# Patient Record
Sex: Female | Born: 1945 | Race: Black or African American | Hispanic: No | Marital: Single | Smoking: Current every day smoker
Health system: Southern US, Community
[De-identification: ages and names within clinical notes are randomized; demographics above are authoritative.]

## PROBLEM LIST (undated history)

## (undated) DIAGNOSIS — E669 Obesity, unspecified: Secondary | ICD-10-CM

## (undated) DIAGNOSIS — M1990 Unspecified osteoarthritis, unspecified site: Secondary | ICD-10-CM

## (undated) DIAGNOSIS — E119 Type 2 diabetes mellitus without complications: Secondary | ICD-10-CM

## (undated) DIAGNOSIS — R519 Headache, unspecified: Secondary | ICD-10-CM

## (undated) DIAGNOSIS — I10 Essential (primary) hypertension: Secondary | ICD-10-CM

## (undated) DIAGNOSIS — R51 Headache: Secondary | ICD-10-CM

## (undated) DIAGNOSIS — E0500 Thyrotoxicosis with diffuse goiter without thyrotoxic crisis or storm: Secondary | ICD-10-CM

## (undated) HISTORY — PX: LUMBAR DISC SURGERY: SHX700

## (undated) HISTORY — PX: TONSILLECTOMY: SUR1361

## (undated) HISTORY — PX: CHOLECYSTECTOMY: SHX55

## (undated) HISTORY — PX: HERNIA REPAIR: SHX51

## (undated) HISTORY — PX: OTHER SURGICAL HISTORY: SHX169

## (undated) HISTORY — PX: THYROIDECTOMY: SHX17

## (undated) HISTORY — PX: ABDOMINAL HYSTERECTOMY: SHX81

## (undated) HISTORY — PX: APPENDECTOMY: SHX54

---

## 2011-08-10 ENCOUNTER — Ambulatory Visit (INDEPENDENT_AMBULATORY_CARE_PROVIDER_SITE_OTHER): Payer: Managed Care, Other (non HMO)

## 2011-08-10 ENCOUNTER — Inpatient Hospital Stay (INDEPENDENT_AMBULATORY_CARE_PROVIDER_SITE_OTHER)
Admission: RE | Admit: 2011-08-10 | Discharge: 2011-08-10 | Disposition: A | Discharge status: Home / Self Care | Payer: Managed Care, Other (non HMO) | Source: Ambulatory Visit | Attending: Family Medicine | Admitting: Family Medicine

## 2011-08-10 ENCOUNTER — Emergency Department (HOSPITAL_COMMUNITY)
Admission: EM | Admit: 2011-08-10 | Discharge: 2011-08-11 | Disposition: A | Discharge status: Home / Self Care | Payer: Managed Care, Other (non HMO) | Attending: Emergency Medicine | Admitting: Emergency Medicine

## 2011-08-10 ENCOUNTER — Emergency Department (HOSPITAL_COMMUNITY): Payer: Managed Care, Other (non HMO)

## 2011-08-10 DIAGNOSIS — R1031 Right lower quadrant pain: Secondary | ICD-10-CM | POA: Insufficient documentation

## 2011-08-10 DIAGNOSIS — K7689 Other specified diseases of liver: Secondary | ICD-10-CM | POA: Insufficient documentation

## 2011-08-10 DIAGNOSIS — E119 Type 2 diabetes mellitus without complications: Secondary | ICD-10-CM | POA: Insufficient documentation

## 2011-08-10 DIAGNOSIS — R10813 Right lower quadrant abdominal tenderness: Secondary | ICD-10-CM

## 2011-08-10 DIAGNOSIS — M79609 Pain in unspecified limb: Secondary | ICD-10-CM | POA: Insufficient documentation

## 2011-08-10 DIAGNOSIS — E039 Hypothyroidism, unspecified: Secondary | ICD-10-CM | POA: Insufficient documentation

## 2011-08-10 DIAGNOSIS — I10 Essential (primary) hypertension: Secondary | ICD-10-CM | POA: Insufficient documentation

## 2011-08-10 DIAGNOSIS — M549 Dorsalgia, unspecified: Secondary | ICD-10-CM | POA: Insufficient documentation

## 2011-08-10 DIAGNOSIS — K219 Gastro-esophageal reflux disease without esophagitis: Secondary | ICD-10-CM | POA: Insufficient documentation

## 2011-08-10 LAB — POCT URINALYSIS DIP (DEVICE)
Glucose, UA: NEGATIVE mg/dL
Hgb urine dipstick: NEGATIVE
Ketones, ur: NEGATIVE mg/dL
Leukocytes, UA: NEGATIVE
Nitrite: NEGATIVE
pH: 5.5 (ref 5.0–8.0)

## 2011-08-10 LAB — DIFFERENTIAL
Lymphocytes Relative: 50 % — ABNORMAL HIGH (ref 12–46)
Lymphs Abs: 4.2 10*3/uL — ABNORMAL HIGH (ref 0.7–4.0)
Monocytes Relative: 6 % (ref 3–12)
Neutrophils Relative %: 42 % — ABNORMAL LOW (ref 43–77)

## 2011-08-10 LAB — HEPATIC FUNCTION PANEL
ALT: 15 U/L (ref 0–35)
AST: 15 U/L (ref 0–37)
Albumin: 3.5 g/dL (ref 3.5–5.2)
Alkaline Phosphatase: 90 U/L (ref 39–117)
Bilirubin, Direct: 0.1 mg/dL (ref 0.0–0.3)
Total Bilirubin: 0.2 mg/dL — ABNORMAL LOW (ref 0.3–1.2)

## 2011-08-10 LAB — POCT I-STAT, CHEM 8
BUN: 9 mg/dL (ref 6–23)
Calcium, Ion: 1.1 mmol/L — ABNORMAL LOW (ref 1.12–1.32)
Chloride: 105 mEq/L (ref 96–112)
Glucose, Bld: 99 mg/dL (ref 70–99)
HCT: 44 % (ref 36.0–46.0)
Potassium: 4 mEq/L (ref 3.5–5.1)

## 2011-08-10 LAB — CBC
HCT: 41.3 % (ref 36.0–46.0)
MCH: 28 pg (ref 26.0–34.0)
MCV: 85.2 fL (ref 78.0–100.0)
RBC: 4.85 MIL/uL (ref 3.87–5.11)
WBC: 8.4 10*3/uL (ref 4.0–10.5)

## 2014-10-17 ENCOUNTER — Observation Stay (HOSPITAL_COMMUNITY): Payer: Managed Care, Other (non HMO)

## 2014-10-17 ENCOUNTER — Observation Stay (HOSPITAL_COMMUNITY): Payer: Medicare (Managed Care)

## 2014-10-17 ENCOUNTER — Emergency Department (HOSPITAL_COMMUNITY): Payer: Medicare (Managed Care)

## 2014-10-17 ENCOUNTER — Observation Stay (HOSPITAL_COMMUNITY)
Admission: EM | Admit: 2014-10-17 | Discharge: 2014-10-17 | Disposition: A | Discharge status: Home / Self Care | Payer: Medicare (Managed Care) | Attending: Internal Medicine | Admitting: Internal Medicine

## 2014-10-17 ENCOUNTER — Encounter (HOSPITAL_COMMUNITY): Payer: Self-pay | Admitting: Emergency Medicine

## 2014-10-17 DIAGNOSIS — F17210 Nicotine dependence, cigarettes, uncomplicated: Secondary | ICD-10-CM | POA: Insufficient documentation

## 2014-10-17 DIAGNOSIS — F329 Major depressive disorder, single episode, unspecified: Secondary | ICD-10-CM | POA: Diagnosis not present

## 2014-10-17 DIAGNOSIS — F4320 Adjustment disorder, unspecified: Secondary | ICD-10-CM | POA: Insufficient documentation

## 2014-10-17 DIAGNOSIS — I10 Essential (primary) hypertension: Secondary | ICD-10-CM

## 2014-10-17 DIAGNOSIS — E119 Type 2 diabetes mellitus without complications: Secondary | ICD-10-CM | POA: Diagnosis not present

## 2014-10-17 DIAGNOSIS — R079 Chest pain, unspecified: Secondary | ICD-10-CM | POA: Diagnosis present

## 2014-10-17 DIAGNOSIS — I517 Cardiomegaly: Secondary | ICD-10-CM

## 2014-10-17 DIAGNOSIS — Z6838 Body mass index (BMI) 38.0-38.9, adult: Secondary | ICD-10-CM | POA: Diagnosis not present

## 2014-10-17 DIAGNOSIS — R519 Headache, unspecified: Secondary | ICD-10-CM | POA: Diagnosis not present

## 2014-10-17 DIAGNOSIS — F419 Anxiety disorder, unspecified: Secondary | ICD-10-CM | POA: Insufficient documentation

## 2014-10-17 DIAGNOSIS — Z79899 Other long term (current) drug therapy: Secondary | ICD-10-CM | POA: Diagnosis not present

## 2014-10-17 DIAGNOSIS — E876 Hypokalemia: Secondary | ICD-10-CM | POA: Diagnosis not present

## 2014-10-17 DIAGNOSIS — R51 Headache: Secondary | ICD-10-CM | POA: Insufficient documentation

## 2014-10-17 DIAGNOSIS — E669 Obesity, unspecified: Secondary | ICD-10-CM | POA: Diagnosis not present

## 2014-10-17 DIAGNOSIS — E039 Hypothyroidism, unspecified: Secondary | ICD-10-CM | POA: Insufficient documentation

## 2014-10-17 DIAGNOSIS — Z720 Tobacco use: Secondary | ICD-10-CM

## 2014-10-17 HISTORY — DX: Essential (primary) hypertension: I10

## 2014-10-17 HISTORY — DX: Type 2 diabetes mellitus without complications: E11.9

## 2014-10-17 HISTORY — DX: Unspecified osteoarthritis, unspecified site: M19.90

## 2014-10-17 HISTORY — DX: Thyrotoxicosis with diffuse goiter without thyrotoxic crisis or storm: E05.00

## 2014-10-17 LAB — CBC WITH DIFFERENTIAL/PLATELET
BASOS ABS: 0 10*3/uL (ref 0.0–0.1)
BASOS PCT: 0 % (ref 0–1)
Eosinophils Absolute: 0.2 10*3/uL (ref 0.0–0.7)
Eosinophils Relative: 2 % (ref 0–5)
HCT: 42.2 % (ref 36.0–46.0)
Hemoglobin: 13.5 g/dL (ref 12.0–15.0)
LYMPHS PCT: 44 % (ref 12–46)
Lymphs Abs: 3.6 10*3/uL (ref 0.7–4.0)
MCH: 28.6 pg (ref 26.0–34.0)
MCHC: 32 g/dL (ref 30.0–36.0)
MCV: 89.4 fL (ref 78.0–100.0)
MONO ABS: 0.6 10*3/uL (ref 0.1–1.0)
Monocytes Relative: 7 % (ref 3–12)
NEUTROS ABS: 3.8 10*3/uL (ref 1.7–7.7)
Neutrophils Relative %: 47 % (ref 43–77)
PLATELETS: 227 10*3/uL (ref 150–400)
RBC: 4.72 MIL/uL (ref 3.87–5.11)
RDW: 14.8 % (ref 11.5–15.5)
WBC: 8.1 10*3/uL (ref 4.0–10.5)

## 2014-10-17 LAB — TSH: TSH: 16.14 u[IU]/mL — ABNORMAL HIGH (ref 0.350–4.500)

## 2014-10-17 LAB — COMPREHENSIVE METABOLIC PANEL
ALBUMIN: 3.4 g/dL — AB (ref 3.5–5.2)
ALT: 21 U/L (ref 0–35)
ANION GAP: 17 — AB (ref 5–15)
AST: 22 U/L (ref 0–37)
Alkaline Phosphatase: 85 U/L (ref 39–117)
BUN: 10 mg/dL (ref 6–23)
CO2: 26 meq/L (ref 19–32)
CREATININE: 0.96 mg/dL (ref 0.50–1.10)
Calcium: 9.6 mg/dL (ref 8.4–10.5)
Chloride: 100 mEq/L (ref 96–112)
GFR calc Af Amer: 70 mL/min — ABNORMAL LOW (ref >90)
GFR calc non Af Amer: 60 mL/min — ABNORMAL LOW (ref >90)
Glucose, Bld: 177 mg/dL — ABNORMAL HIGH (ref 70–99)
Potassium: 3.4 mEq/L — ABNORMAL LOW (ref 3.7–5.3)
Sodium: 143 mEq/L (ref 137–147)
Total Protein: 7.1 g/dL (ref 6.0–8.3)

## 2014-10-17 LAB — CBC
HCT: 41.9 % (ref 36.0–46.0)
HEMOGLOBIN: 13.9 g/dL (ref 12.0–15.0)
MCH: 29.6 pg (ref 26.0–34.0)
MCHC: 33.2 g/dL (ref 30.0–36.0)
MCV: 89.3 fL (ref 78.0–100.0)
PLATELETS: 230 10*3/uL (ref 150–400)
RBC: 4.69 MIL/uL (ref 3.87–5.11)
RDW: 14.9 % (ref 11.5–15.5)
WBC: 8.6 10*3/uL (ref 4.0–10.5)

## 2014-10-17 LAB — BASIC METABOLIC PANEL
ANION GAP: 15 (ref 5–15)
BUN: 9 mg/dL (ref 6–23)
CO2: 27 meq/L (ref 19–32)
Calcium: 9.9 mg/dL (ref 8.4–10.5)
Chloride: 99 mEq/L (ref 96–112)
Creatinine, Ser: 0.96 mg/dL (ref 0.50–1.10)
GFR calc Af Amer: 70 mL/min — ABNORMAL LOW (ref >90)
GFR, EST NON AFRICAN AMERICAN: 60 mL/min — AB (ref >90)
GLUCOSE: 180 mg/dL — AB (ref 70–99)
POTASSIUM: 3.2 meq/L — AB (ref 3.7–5.3)
SODIUM: 141 meq/L (ref 137–147)

## 2014-10-17 LAB — PRO B NATRIURETIC PEPTIDE: Pro B Natriuretic peptide (BNP): <5 pg/mL (ref 0–125)

## 2014-10-17 LAB — TROPONIN I
Troponin I: <0.3 ng/mL (ref <0.30)
Troponin I: <0.3 ng/mL (ref <0.30)

## 2014-10-17 LAB — I-STAT TROPONIN, ED: TROPONIN I, POC: 0 ng/mL (ref 0.00–0.08)

## 2014-10-17 LAB — MAGNESIUM: MAGNESIUM: 1.8 mg/dL (ref 1.5–2.5)

## 2014-10-17 LAB — HEMOGLOBIN A1C
Hgb A1c MFr Bld: 9.6 % — ABNORMAL HIGH (ref <5.7)
Mean Plasma Glucose: 229 mg/dL — ABNORMAL HIGH (ref <117)

## 2014-10-17 LAB — GLUCOSE, CAPILLARY
Glucose-Capillary: 188 mg/dL — ABNORMAL HIGH (ref 70–99)
Glucose-Capillary: 210 mg/dL — ABNORMAL HIGH (ref 70–99)

## 2014-10-17 MED ORDER — MORPHINE SULFATE 2 MG/ML IJ SOLN: 1.0000 mg | INTRAMUSCULAR | Status: DC | PRN

## 2014-10-17 MED ORDER — SODIUM CHLORIDE 0.9 % IJ SOLN: 3.0000 mL | Freq: Two times a day (BID) | INTRAMUSCULAR | Status: DC

## 2014-10-17 MED ORDER — FLUOXETINE HCL 20 MG PO CAPS
20.0000 mg | ORAL_CAPSULE | Freq: Every day | ORAL | Status: DC
  Administered 2014-10-17: 20 mg via ORAL
  Filled 2014-10-17: qty 1

## 2014-10-17 MED ORDER — TECHNETIUM TC 99M SESTAMIBI GENERIC - CARDIOLITE
30.0000 | Freq: Once | INTRAVENOUS | Status: AC | PRN
  Administered 2014-10-17: 30 via INTRAVENOUS

## 2014-10-17 MED ORDER — LEVOTHYROXINE SODIUM 25 MCG PO TABS: 25.0000 ug | ORAL_TABLET | Freq: Every day | ORAL | Status: AC

## 2014-10-17 MED ORDER — PANTOPRAZOLE SODIUM 40 MG PO TBEC
80.0000 mg | DELAYED_RELEASE_TABLET | Freq: Every day | ORAL | Status: DC
  Administered 2014-10-17: 80 mg via ORAL
  Filled 2014-10-17: qty 2

## 2014-10-17 MED ORDER — NITROGLYCERIN 0.4 MG SL SUBL: 0.4000 mg | SUBLINGUAL_TABLET | SUBLINGUAL | Status: DC | PRN

## 2014-10-17 MED ORDER — DIAZEPAM 10 MG PO TABS: 5.0000 mg | ORAL_TABLET | Freq: Two times a day (BID) | ORAL | Status: AC | PRN

## 2014-10-17 MED ORDER — DIAZEPAM 5 MG PO TABS: 10.0000 mg | ORAL_TABLET | Freq: Every evening | ORAL | Status: DC | PRN

## 2014-10-17 MED ORDER — LOSARTAN POTASSIUM 50 MG PO TABS
100.0000 mg | ORAL_TABLET | Freq: Every day | ORAL | Status: DC
  Administered 2014-10-17: 100 mg via ORAL
  Filled 2014-10-17: qty 2

## 2014-10-17 MED ORDER — LORAZEPAM 2 MG/ML IJ SOLN
1.0000 mg | Freq: Once | INTRAMUSCULAR | Status: AC
  Administered 2014-10-17: 1 mg via INTRAVENOUS
  Filled 2014-10-17: qty 1

## 2014-10-17 MED ORDER — REGADENOSON 0.4 MG/5ML IV SOLN
INTRAVENOUS | Status: AC
  Filled 2014-10-17: qty 5

## 2014-10-17 MED ORDER — AMLODIPINE BESYLATE 5 MG PO TABS
5.0000 mg | ORAL_TABLET | Freq: Every day | ORAL | Status: DC
  Administered 2014-10-17: 5 mg via ORAL
  Filled 2014-10-17: qty 1

## 2014-10-17 MED ORDER — POTASSIUM CHLORIDE IN NACL 20-0.9 MEQ/L-% IV SOLN
INTRAVENOUS | Status: DC
  Administered 2014-10-17: 09:00:00 via INTRAVENOUS
  Filled 2014-10-17 (×2): qty 1000

## 2014-10-17 MED ORDER — LEVOTHYROXINE SODIUM 25 MCG PO TABS
225.0000 ug | ORAL_TABLET | Freq: Every day | ORAL | Status: DC
  Filled 2014-10-17: qty 1

## 2014-10-17 MED ORDER — REGADENOSON 0.4 MG/5ML IV SOLN
0.4000 mg | Freq: Once | INTRAVENOUS | Status: AC
  Administered 2014-10-17: 0.4 mg via INTRAVENOUS

## 2014-10-17 MED ORDER — LOSARTAN POTASSIUM-HCTZ 100-25 MG PO TABS: 1.0000 | ORAL_TABLET | Freq: Every day | ORAL | Status: DC

## 2014-10-17 MED ORDER — ASPIRIN EC 325 MG PO TBEC
325.0000 mg | DELAYED_RELEASE_TABLET | Freq: Every day | ORAL | Status: DC
Start: 2014-10-17 — End: 2014-10-17
  Administered 2014-10-17: 325 mg via ORAL
  Filled 2014-10-17: qty 1

## 2014-10-17 MED ORDER — HYDROCHLOROTHIAZIDE 25 MG PO TABS
25.0000 mg | ORAL_TABLET | Freq: Every day | ORAL | Status: DC
  Administered 2014-10-17: 25 mg via ORAL
  Filled 2014-10-17: qty 1

## 2014-10-17 MED ORDER — LEVOTHYROXINE SODIUM 200 MCG PO TABS
200.0000 ug | ORAL_TABLET | Freq: Every day | ORAL | Status: DC
  Filled 2014-10-17 (×2): qty 1

## 2014-10-17 MED ORDER — HEPARIN SODIUM (PORCINE) 5000 UNIT/ML IJ SOLN
5000.0000 [IU] | Freq: Three times a day (TID) | INTRAMUSCULAR | Status: DC
  Administered 2014-10-17: 5000 [IU] via SUBCUTANEOUS
  Filled 2014-10-17: qty 1

## 2014-10-17 MED ORDER — POTASSIUM CHLORIDE CRYS ER 20 MEQ PO TBCR
40.0000 meq | EXTENDED_RELEASE_TABLET | Freq: Once | ORAL | Status: AC
  Administered 2014-10-17: 40 meq via ORAL
  Filled 2014-10-17: qty 2

## 2014-10-17 MED ORDER — OXYCODONE-ACETAMINOPHEN 5-325 MG PO TABS: 1.0000 | ORAL_TABLET | ORAL | Status: DC | PRN

## 2014-10-17 MED ORDER — INSULIN ASPART 100 UNIT/ML ~~LOC~~ SOLN
0.0000 [IU] | SUBCUTANEOUS | Status: DC
  Administered 2014-10-17: 2 [IU] via SUBCUTANEOUS
  Administered 2014-10-17: 3 [IU] via SUBCUTANEOUS

## 2014-10-17 MED ORDER — TECHNETIUM TC 99M SESTAMIBI GENERIC - CARDIOLITE
10.0000 | Freq: Once | INTRAVENOUS | Status: AC | PRN
  Administered 2014-10-17: 10 via INTRAVENOUS

## 2014-10-17 NOTE — Progress Notes (Signed)
DC IV and tele per MD orders and protocol; DC instructions reviewed with patient; no further questions for patient; paper prescription given to patient for Valium 10mg  and Synthroid 25 MCG.  Hermina BartersBOWMAN, Accalia Rigdon M, RN

## 2014-10-17 NOTE — H&P (Addendum)
Hospitalist Admission History and Physical  Patient name: Joy Ryan Medical record number: [id number] Date of birth: 11-Jul-1946 Age: 68 y.o. Gender: female  Primary Care Provider: No PCP Per Patient  Chief Complaint: chest pain, headache  History of Present Illness:This is a 68 y.o. year old female with significant past medical history of HTN, type 2 DM, tobacco abuse, obesity presenting with chest pain, headache. Pt states that she has had intermittent central chest pressure over since 9am yesterday. Pt states that she has been under a lot of stress recently as she had had 2 major deaths in the family within the last 24 hours. Chest pain is described as more of a pressure-mild to moderate. Sometimes radiates to the right. Sometimes worse with movement and deep breathing. States that she has also had mild r sided headache. States that she has headache chronically, though this headache is somewhat new in distribution. No hemiparesis or confusion. Sugars have been fairly normal per pt-in 130s. Though was 220 when EMS arrived per pt. Denies any prior hx/o cardiac issues in the past.  On presentation to the ER, HR 70s-80s,  resp 10s, BP 100s-130s, satting >96% on RA. [place] WNL apart from K 3.2., Glu 180. Trop neg x1. EKG NSR. CXR does show some mild prominence ( ddx atypical infection vs. Interstitial edema). Pt given full dose ASA by EMS in route with resolution of chest pressure. Pt was also given sublingual NTG with minimal improvement.  Cardiovascular risk factors: age, obesity, DM, HTN, tobacco abuse, family history  Heart Score: 4-5   Assessment and Plan: Joy Ryan is a 68 y.o. year old female presenting with chest pain, headache   Active Problems:   Chest pain   1-Chest Pain  -Somewhat atypical features in pt with multiple CV risk factors -cycle CEs -risk stratification labs  -check pro BNP given ? Interstitial edema on CXR  -? tokatsubo in setting of significant  stress reaction albeit lower on ddx. Workup relatively negative thus far apart from CXR. 2D ECHO.  -will likely benefit from stress test and formal cards consult  -full dose ASA -prn NTG  2- Headache  -head CT pending  -no focal deficits on exam  -currently headache free  -follow   3- HTN -BP stable  -cont home regimen   4- DM  -SSI  -A1C  5- Anxiety  -Anxiety flare in setting of recent multiple deaths in the family  -cont home regimen   FEN/GI: heart healthy, carb modified diet  Prophylaxis: sub q heparin  Disposition: pending further evaluation  Code Status:Full Code    Patient Active Problem List   Diagnosis Date Noted  . Chest pain 10/17/2014   Past Medical History: Past Medical History  Diagnosis Date  . Diabetes mellitus without complication   . Hypertension   . Arthritis   . Graves disease     Past Surgical History: Past Surgical History  Procedure Laterality Date  . Thyroidectomy    . Lumbar disc surgery    . Cholecystectomy    . Abdominal hysterectomy    . Hernia repair    . Tonsillectomy    . Appendectomy    . Polyps colon      Social History: History   Social History  . Marital Status: Single    Spouse Name: N/A    Number of Children: N/A  . Years of Education: N/A   Social History Main Topics  . Smoking status: Current Every Day Smoker -- 1.00  packs/day    Types: Cigarettes  . Smokeless tobacco: Never Used  . Alcohol Use: None  . Drug Use: No  . Sexual Activity: None   Other Topics Concern  . None   Social History Narrative  . None    Family History: No family history on file.  Allergies: No Known Allergies  Current Facility-Administered Medications  Medication Dose Route Frequency Provider Last Rate Last Dose  . 0.9 % NaCl with KCl 20 mEq/ L  infusion   Intravenous Continuous Doree AlbeeSteven Kahle Mcqueen, MD      . amLODipine (NORVASC) tablet 5 mg  5 mg Oral Daily Doree AlbeeSteven Arnet Hofferber, MD      . aspirin EC tablet 325 mg  325 mg Oral  Daily Doree AlbeeSteven Aundreya Souffrant, MD      . diazepam (VALIUM) tablet 10 mg  10 mg Oral QHS PRN Doree AlbeeSteven Maisa Bedingfield, MD      . FLUoxetine (PROZAC) capsule 20 mg  20 mg Oral Daily Doree AlbeeSteven Armonte Tortorella, MD      . heparin injection 5,000 Units  5,000 Units Subcutaneous 3 times per day Doree AlbeeSteven Emberlie Gotcher, MD      . insulin aspart (novoLOG) injection 0-9 Units  0-9 Units Subcutaneous 6 times per day Doree AlbeeSteven Undray Allman, MD      . levothyroxine (SYNTHROID, LEVOTHROID) tablet 200 mcg  200 mcg Oral QAC breakfast Doree AlbeeSteven Gaby Harney, MD      . losartan-hydrochlorothiazide [facility](HYZAAR) 100-25 MG per tablet 1 tablet  1 tablet Oral Daily Doree AlbeeSteven Daved Mcfann, MD      . morphine 2 MG/ML injection 1-2 mg  1-2 mg Intravenous Q2H PRN Doree AlbeeSteven Shamyia Grandpre, MD      . nitroGLYCERIN (NITROSTAT) SL tablet 0.4 mg  0.4 mg Sublingual Q5 min PRN Doree AlbeeSteven Cherelle Midkiff, MD      . oxyCODONE-acetaminophen (PERCOCET/ROXICET) 5-325 MG per tablet 1 tablet  1 tablet Oral Q4H PRN Doree AlbeeSteven Karoline Fleer, MD      . pantoprazole (PROTONIX) EC tablet 80 mg  80 mg Oral Daily Doree AlbeeSteven Pansy Ostrovsky, MD      . sodium chloride 0.9 % injection 3 mL  3 mL Intravenous Q12H Doree AlbeeSteven Odel Schmid, MD       Current Outpatient Prescriptions  Medication Sig Dispense Refill  . amLODipine (NORVASC) 5 MG tablet Take 5 mg by mouth daily.      . cyclobenzaprine (FLEXERIL) 10 MG tablet Take 10 mg by mouth 3 (three) times daily as needed for muscle spasms.      . diazepam (VALIUM) 10 MG tablet Take 10 mg by mouth at bedtime as needed for anxiety.      Marland Kitchen. FLUoxetine (PROZAC) 20 MG capsule Take 20 mg by mouth daily.      Marland Kitchen. levothyroxine (SYNTHROID, LEVOTHROID) 200 MCG tablet Take 200 mcg by mouth daily before breakfast.      . losartan-hydrochlorothiazide (HYZAAR) 100-25 MG per tablet Take 1 tablet by mouth daily.      . metFORMIN (GLUCOPHAGE) 1000 MG tablet Take 1,000 mg by mouth 2 (two) times daily with a meal.      . omeprazole (PRILOSEC) 40 MG capsule Take 40 mg by mouth daily.      Marland Kitchen. oxyCODONE-acetaminophen (PERCOCET/ROXICET) 5-325 MG per  tablet Take 1 tablet by mouth every 4 (four) hours as needed for severe pain.       Review Of Systems: 12 point ROS negative except as noted above in HPI.  Physical Exam: Filed Vitals:   10/17/14 0400  BP: 126/72  Pulse: 78  Resp: 15    General:  alert and cooperative HEENT: PERRLA and extra ocular movement intact Heart: S1, S2 normal, no murmur, rub or gallop, regular rate and rhythm Lungs: clear to auscultation, no wheezes or rales and unlabored breathing Abdomen: abdomen is soft without significant tenderness, masses, organomegaly or guarding Extremities: extremities normal, atraumatic, no cyanosis or edema Skin:no rashes, no ecchymoses Neurology: normal without focal findings  Labs and Imaging: Lab Results  Component Value Date/Time   NA 141 10/17/2014  2:35 AM   K 3.2* 10/17/2014  2:35 AM   CL 99 10/17/2014  2:35 AM   CO2 27 10/17/2014  2:35 AM   BUN 9 10/17/2014  2:35 AM   CREATININE 0.96 10/17/2014  2:35 AM   GLUCOSE 180* 10/17/2014  2:35 AM   Lab Results  Component Value Date   WBC 8.6 10/17/2014   HGB 13.9 10/17/2014   HCT 41.9 10/17/2014   MCV 89.3 10/17/2014   PLT 230 10/17/2014    Dg Chest Port 1 View  10/17/2014   CLINICAL DATA:  Mid chest pain  EXAM: PORTABLE CHEST - 1 VIEW  COMPARISON:  None.  FINDINGS: Heart size and mediastinal contours within normal range. Mild interstitial prominence at the lower lobes may be accentuated by overlying soft tissues. No focal consolidation. No pleural effusion or pneumothorax. Partially imaged cervical fusion hardware. No acute osseous finding.  IMPRESSION: Mild interstitial prominence may be accentuated by overlying soft tissues versus atypical/viral infection or interstitial edema in the appropriate clinical setting.   Electronically Signed   By: Jearld LeschAndrew  DelGaizo M.D.   On: 10/17/2014 02:46           Doree AlbeeSteven Alverto Shedd MD  Pager: [phone]

## 2014-10-17 NOTE — ED Provider Notes (Addendum)
CSN: [id number]     Arrival date & time 10/17/14  0128 History   First MD Initiated Contact with Patient 10/17/14 0235     Chief Complaint  Patient presents with  . Chest Pain  . Headache     (Consider location/radiation/quality/duration/timing/severity/associated sxs/prior Treatment) HPI 68 year old female presents to emergency department from home via EMS with complaint of headache and chest pain.  Patient reports starting around 9 AM this morning she developed central chest pain with radiation into her right shoulder.  Pain has been intermittent throughout the day.  It is a squeezing heavy type of pain.  She has had mild shortness of breath but denies diaphoresis or nausea with the pain.  She has history of diabetes and hypertension.  She smokes 15 cigarettes a day.  She has strong family history of coronary disease.  Patient has had 2 deaths in the family in the last 24 hours, her grandson followed by her fianc.  She has been under a large amount of stress due to these deaths.  Patient received aspirin and nitroglycerin in route from EMS.  She reports only mild improvement with nitroglycerin, but feels that the aspirin helped her more.  No prior history of similar pain.  No prior history of chest pain workup. Past Medical History  Diagnosis Date  . Diabetes mellitus without complication   . Hypertension   . Arthritis   . Graves disease    Past Surgical History  Procedure Laterality Date  . Thyroidectomy    . Lumbar disc surgery    . Cholecystectomy    . Abdominal hysterectomy    . Hernia repair    . Tonsillectomy    . Appendectomy    . Polyps colon     No family history on file. History  Substance Use Topics  . Smoking status: Current Every Day Smoker -- 1.00 packs/day    Types: Cigarettes  . Smokeless tobacco: Never Used  . Alcohol Use: Not on file   OB History   Grav Para Term Preterm Abortions TAB SAB Ect Mult Living                 Review of Systems   See  History of Present Illness; otherwise all other systems are reviewed and negative  Allergies  Review of patient's allergies indicates no known allergies.  Home Medications   Prior to Admission medications   Medication Sig Start Date End Date Taking? Authorizing Provider  amLODipine (NORVASC) 5 MG tablet Take 5 mg by mouth daily.   Yes Historical Provider, MD  cyclobenzaprine (FLEXERIL) 10 MG tablet Take 10 mg by mouth 3 (three) times daily as needed for muscle spasms.   Yes Historical Provider, MD  diazepam (VALIUM) 10 MG tablet Take 10 mg by mouth at bedtime as needed for anxiety.   Yes Historical Provider, MD  FLUoxetine (PROZAC) 20 MG capsule Take 20 mg by mouth daily.   Yes Historical Provider, MD  levothyroxine (SYNTHROID, LEVOTHROID) 200 MCG tablet Take 200 mcg by mouth daily before breakfast.   Yes Historical Provider, MD  losartan-hydrochlorothiazide (HYZAAR) 100-25 MG per tablet Take 1 tablet by mouth daily.   Yes Historical Provider, MD  metFORMIN (GLUCOPHAGE) 1000 MG tablet Take 1,000 mg by mouth 2 (two) times daily with a meal.   Yes Historical Provider, MD  omeprazole (PRILOSEC) 40 MG capsule Take 40 mg by mouth daily.   Yes Historical Provider, MD  oxyCODONE-acetaminophen (PERCOCET/ROXICET) 5-325 MG per tablet Take 1 tablet by  mouth every 4 (four) hours as needed for severe pain.   Yes Historical Provider, MD   BP 129/80  Pulse 78  Resp 8  Ht 5\' 3"  (1.6 m)  Wt 218 lb (98.884 kg)  BMI 38.63 kg/m2  SpO2 98% Physical Exam  Nursing note and vitals reviewed. Constitutional: She is oriented to person, place, and time. She appears well-developed and well-nourished.  HENT:  Head: Normocephalic and atraumatic.  Nose: Nose normal.  Mouth/Throat: Oropharynx is clear and moist.  Eyes: Conjunctivae and EOM are normal. Pupils are equal, round, and reactive to light.  Neck: Normal range of motion. Neck supple. No JVD present. No tracheal deviation present. No thyromegaly present.   Cardiovascular: Normal rate, regular rhythm, normal heart sounds and intact distal pulses.  Exam reveals no gallop and no friction rub.   No murmur heard. Pulmonary/Chest: Effort normal and breath sounds normal. No stridor. No respiratory distress. She has no wheezes. She has no rales. She exhibits no tenderness.  Abdominal: Soft. Bowel sounds are normal. She exhibits no distension and no mass. There is no tenderness. There is no rebound and no guarding.  Musculoskeletal: Normal range of motion. She exhibits no edema and no tenderness.  Lymphadenopathy:    She has no cervical adenopathy.  Neurological: She is alert and oriented to person, place, and time. She displays normal reflexes. She exhibits normal muscle tone. Coordination normal.  Skin: Skin is warm and dry. No rash noted. No erythema. No pallor.  Psychiatric: She has a normal mood and affect. Her behavior is normal. Judgment and thought content normal.    ED Course  Procedures (including critical care time) Labs Review Labs Reviewed  BASIC METABOLIC PANEL - Abnormal; Notable for the following:    Potassium 3.2 (*)    Glucose, Bld 180 (*)    GFR calc non Af Amer 60 (*)    GFR calc Af Amer 70 (*)    All other components within normal limits  CBC  I-STAT TROPOININ, ED    Imaging Review Dg Chest Port 1 View  10/17/2014   CLINICAL DATA:  Mid chest pain  EXAM: PORTABLE CHEST - 1 VIEW  COMPARISON:  None.  FINDINGS: Heart size and mediastinal contours within normal range. Mild interstitial prominence at the lower lobes may be accentuated by overlying soft tissues. No focal consolidation. No pleural effusion or pneumothorax. Partially imaged cervical fusion hardware. No acute osseous finding.  IMPRESSION: Mild interstitial prominence may be accentuated by overlying soft tissues versus atypical/viral infection or interstitial edema in the appropriate clinical setting.   Electronically Signed   By: Jearld LeschAndrew  DelGaizo M.D.   On: 10/17/2014  02:46     EKG Interpretation   Date/Time:  Wednesday October 17 2014 01:46:17 EDT Ventricular Rate:  84 PR Interval:  163 QRS Duration: 83 QT Interval:  419 QTC Calculation: 495 R Axis:   43 Text Interpretation:  Sinus rhythm Anteroseptal infarct, age indeterminate  No old tracing to compare Confirmed by Caydn Justen  MD, Kimbra Marcelino ([id number]) on  10/17/2014 2:40:35 AM      MDM   Final diagnoses:  Chest pain with moderate risk for cardiac etiology    68 year old female with chest pain that has been intermittent throughout the day, multiple risk factors for coronary disease along with severe life stressors.  Differential would include "open heart syndrome".  EKG without ST elevation.  Troponin negative 1.  I have discuss with hospitalist for chest pain observation admission.  Olivia Mackielga M Stephanne Greeley, MD  10/17/14 0344  Olivia Mackie, MD 10/17/14 [phone]

## 2014-10-17 NOTE — Progress Notes (Signed)
*  PRELIMINARY RESULTS* Echocardiogram 2D Echocardiogram has been performed.  Jeryl ColumbiaLLIOTT, Miller Limehouse 10/17/2014, 4:37 PM

## 2014-10-17 NOTE — ED Notes (Signed)
Pt arrives via [facility] from daughters house. Pt c/o intermittent chest pain that began yesterday around 9am. Describes pain as dull, pressure pain. 9/10. 324 aspirin given and nitro x1 given with some relief 4/10. 22G LFA

## 2014-10-17 NOTE — Progress Notes (Signed)
UR completed 

## 2014-10-17 NOTE — ED Notes (Signed)
Family at bedside. 

## 2014-10-17 NOTE — Progress Notes (Signed)
10/17/14 1600  Clinical Encounter Type  Visited With Patient and family together  Visit Type Initial  Referral From Nurse  Spiritual Encounters  Spiritual Needs Emotional;Grief support  Stress Factors  Patient Stress Factors Exhausted;Loss;Major life changes  Family Stress Factors Loss;Major life changes   Chaplain visited with patient for roughly an hour today. Patient was being visited by her ex-husband and two of her daughters when chaplain arrived. Patient has experienced a lot of loss in the past week. Patient explained that both her fiancee and grandson died in the same week. Patient is living in [place] and was on her way up to be with her daughter as she grieved over the loss of her son. Meanwhile her fiancee's health rapidly declined and he died before they left for [place]. Patient has a strong faith in God and a strong family support system that she is recognizing and using more fully now. Patient admitted that she was trying to take on all of the pieces of the week and didn't notice her own health declining. Patient also admitted that she has a tendency to take on a lot. Patient said she is now realizing that she doesn't need to do or handle everything and is looking forward to finding rest and taking care of her self when she returns home. Patient was appreciative of visit and said it was good to get all of these events out vocally to someone. Chaplain will continue to provide emotional and spiritual care for patient and patient's family as needed. Samson Ralph, Tommi EmeryBlake R, Chaplain 4:32 PM

## 2014-10-17 NOTE — Consult Note (Signed)
CARDIOLOGY CONSULT NOTE   Patient ID: Joy Ryan MRN: [id number], DOB/AGE: [birth year]   Admit date: 10/17/2014 Date of Consult: 10/17/2014   Primary Physician: No PCP Per Patient Primary Cardiologist: None  Pt. Profile  A 68 year old woman admitted with chest pain.  She has multiple risk factors for coronary disease.  She is visiting from [place].  Problem List  Past Medical History  Diagnosis Date  . Diabetes mellitus without complication   . Hypertension   . Arthritis   . Graves disease     Past Surgical History  Procedure Laterality Date  . Thyroidectomy    . Lumbar disc surgery    . Cholecystectomy    . Abdominal hysterectomy    . Hernia repair    . Tonsillectomy    . Appendectomy    . Polyps colon       Allergies  No Known Allergies  HPI   This 68 year old woman was admitted because of substernal chest discomfort.  She does not have any history of known ischemic heart disease.  She has multiple risk factors for ischemic heart disease including obesity, diabetes mellitus, high blood pressure, tobacco abuse, and family history.  She has been under a lot of recent stress.  Her fianc died several days ago and a grandson also died recently and she is up here in [place] to attend the funeral services.  She does not have any history of previous exertional chest discomfort to suggest angina pectoris.  She is relatively sedentary however. Her initial troponin is normal.  Her electrocardiogram shows poor R-wave progression in V1 and V2 suggesting possible old anteroseptal myocardial infarction.  However, no acute ST-T wave abnormalities are seen.  Inpatient Medications  . amLODipine  5 mg Oral Daily  . aspirin EC  325 mg Oral Daily  . FLUoxetine  20 mg Oral Daily  . heparin  5,000 Units Subcutaneous 3 times per day  . hydrochlorothiazide  25 mg Oral Daily  . insulin aspart  0-9 Units Subcutaneous 6 times per day  . levothyroxine  200 mcg Oral QAC breakfast   . losartan  100 mg Oral Daily  . pantoprazole  80 mg Oral Daily  . sodium chloride  3 mL Intravenous Q12H    Family History Her family history is positive for heart disease.  Social History History   Social History  . Marital Status: Single    Spouse Name: N/A    Number of Children: N/A  . Years of Education: N/A   Occupational History  . Not on file.   Social History Main Topics  . Smoking status: Current Every Day Smoker -- 1.00 packs/day    Types: Cigarettes  . Smokeless tobacco: Never Used  . Alcohol Use: Not on file  . Drug Use: No  . Sexual Activity: Not on file   Other Topics Concern  . Not on file   Social History Narrative  . No narrative on file     Review of Systems  General:  No chills, fever, night sweats or weight changes.  Cardiovascular:  No chest pain, dyspnea on exertion, edema, orthopnea, palpitations, paroxysmal nocturnal dyspnea. Dermatological: No rash, lesions/masses Respiratory: No cough, dyspnea Urologic: No hematuria, dysuria Abdominal:   No nausea, vomiting, diarrhea, bright red blood per rectum, melena, or hematemesis Neurologic:  No visual changes, wkns, changes in mental status. All other systems reviewed and are otherwise negative except as noted above.  Physical Exam  Blood pressure 99/55, pulse 80, temperature 97.9  F (36.6 C), temperature source Oral, resp. rate 18, height 5\' 3"  (1.6 m), weight 220 lb 8 oz (100.018 kg), SpO2 96.00%.  General: Pleasant, NAD Psych: Normal affect. Neuro: Alert and oriented X 3. Moves all extremities spontaneously. HEENT: Normal  Neck: Supple without bruits or JVD. Lungs:  Resp regular and unlabored, CTA. Heart: RRR no s3, s4, or murmurs. Abdomen: Soft, non-tender, non-distended, BS + x 4.  Extremities: No clubbing, cyanosis or edema. DP/PT/Radials 2+ and equal bilaterally.  Labs   Recent Labs  10/17/14 0448  TROPONINI <0.30   Lab Results  Component Value Date   WBC 8.1 10/17/2014     HGB 13.5 10/17/2014   HCT 42.2 10/17/2014   MCV 89.4 10/17/2014   PLT 227 10/17/2014     Recent Labs Lab 10/17/14 0448  NA 143  K 3.4*  CL 100  CO2 26  BUN 10  CREATININE 0.96  CALCIUM 9.6  PROT 7.1  BILITOT <0.2*  ALKPHOS 85  ALT 21  AST 22  GLUCOSE 177*   No results found for this basename: CHOL,  HDL,  LDLCALC,  TRIG   No results found for this basename: DDIMER    Radiology/Studies  Ct Head Wo Contrast  10/17/2014   CLINICAL DATA:  Headache  EXAM: CT HEAD WITHOUT CONTRAST  TECHNIQUE: Contiguous axial images were obtained from the base of the skull through the vertex without intravenous contrast.  COMPARISON:  None.  FINDINGS: Maintained gray-white differentiation. No CT evidence of an acute infarction. Mild hyperdense artifact along the periphery of the convexities. No intraparenchymal hemorrhage, mass, mass effect, or abnormal extra-axial fluid collection. The ventricles, cisterns, and sulci are normal in size, shape, and position for age. The visualized paranasal sinuses and mastoid air cells are predominantly clear.  IMPRESSION: No acute intracranial abnormality.   Electronically Signed   By: Jearld LeschAndrew  DelGaizo M.D.   On: 10/17/2014 04:45   Dg Chest Port 1 View  10/17/2014   CLINICAL DATA:  Mid chest pain  EXAM: PORTABLE CHEST - 1 VIEW  COMPARISON:  None.  FINDINGS: Heart size and mediastinal contours within normal range. Mild interstitial prominence at the lower lobes may be accentuated by overlying soft tissues. No focal consolidation. No pleural effusion or pneumothorax. Partially imaged cervical fusion hardware. No acute osseous finding.  IMPRESSION: Mild interstitial prominence may be accentuated by overlying soft tissues versus atypical/viral infection or interstitial edema in the appropriate clinical setting.   Electronically Signed   By: Jearld LeschAndrew  DelGaizo M.D.   On: 10/17/2014 02:46    ECG  Sinus rhythm Anteroseptal infarct, age indeterminate No old tracing to  compare Confirmed by OTTER MD, OLGA ([id number]) on 10/17/2014 2:40:35 AM  ASSESSMENT AND PLAN  1.  Chest pain with typical and atypical features.  She reports partial improvement after sublingual nitroglycerin.  Currently she is pain-free. 2.  Diabetes mellitus type 2 3.  Essential hypertension without heart failure 4.  Anxiety and depression related to multiple deaths in the family recently. 5.  Hypokalemia  Disposition: Proceed with Lexiscan Myoview and with 2-D echo.  If stress test and echo are unremarkable she can probably be discharged later this afternoon. Potassium repletion per primary service.  Signed, Cassell Clementhomas Keevin Panebianco, MD  10/17/2014, 9:18 AM

## 2014-10-17 NOTE — Progress Notes (Addendum)
Patient admitted after midnight. Chart reviewed. Patient examined. From [place]. Stress test there 7-8 years ago. No CP currently. p BNP normal. CT brain normal. HA after nitro. Gets them frequently. Has not yet eaten breakfast. Will make NPO and consult cardiology to consider stress test. TSH high. Will adjust synthroid.  Crista Curborinna Victorya Hillman, MD [facility] [phone]

## 2014-10-17 NOTE — Discharge Summary (Signed)
Physician Discharge Summary  Joy MeigsBretta M Ryan VHQ:[id number] DOB: 1946/02/17 DOA: 10/17/2014  PCP: No PCP Per Patient  Admit date: 10/17/2014  Discharge Diagnoses:  Active Problems:   Chest pain   Headache   Type 2 diabetes mellitus   HTN (hypertension)   Tobacco abuse hypothyroidism Hypokalemia Grief reaction  Discharge Condition: stable  Filed Weights   10/17/14 0147 10/17/14 0621  Weight: 98.884 kg (218 lb) 100.018 kg (220 lb 8 oz)    History of present illness:  68 y.o. year old female with significant past medical history of HTN, type 2 DM, tobacco abuse, obesity presenting with chest pain, headache. Pt states that she has had intermittent central chest pressure over since 9am yesterday. Pt states that she has been under a lot of stress recently as she had had 2 major deaths in the family within the last 24 hours. Chest pain is described as more of a pressure-mild to moderate. Sometimes radiates to the right. Sometimes worse with movement and deep breathing. States that she has also had mild r sided headache. States that she has headache chronically, though this headache is somewhat new in distribution. No hemiparesis or confusion. Sugars have been fairly normal per pt-in 130s. Though was 220 when EMS arrived per pt. Denies any prior hx/o cardiac issues in the past.  On presentation to the ER, HR 70s-80s, resp 10s, BP 100s-130s, satting >96% on RA. Bloodwork WNL apart from K 3.2., Glu 180. Trop neg x1. EKG NSR. CXR does show some mild prominence ( ddx atypical infection vs. Interstitial edema). Pt given full dose ASA by EMS in route with resolution of chest pressure. Pt was also given sublingual NTG with minimal improvement.  Cardiovascular risk factors: age, obesity, DM, HTN, tobacco abuse, family history   Hospital Course:  Admitted to telemetry.  MI ruled out. Cardiology consulted. Patient had negative myoview and echo without anything concerning. CT brain nothing acute.  Chest pain and headache likely stress related.  TSH elevated at 16, so synthroid increased from 100 mcg to 125 mcg daily.  Hypokalemia corrected.  Procedures:  none  Consultations:  cardiology  Discharge Exam: Filed Vitals:   10/17/14 1426  BP: 113/68  Pulse: 90  Temp: 98.3 F (36.8 C)  Resp: 18    General: tearful.  Cardiovascular: rRR Respiratory: CTA abd s, nt, nd Ext no cCE   Discharge Instructions   Activity as tolerated - No restrictions    Complete by:  As directed      Diet - low sodium heart healthy    Complete by:  As directed      Diet Carb Modified    Complete by:  As directed           Current Discharge Medication List    CONTINUE these medications which have CHANGED   Details  diazepam (VALIUM) 10 MG tablet Take 0.5 tablets (5 mg total) by mouth every 12 (twelve) hours as needed for anxiety. Qty: 20 tablet, Refills: 0    !! levothyroxine (SYNTHROID, LEVOTHROID) 25 MCG tablet Take 1 tablet (25 mcg total) by mouth daily before breakfast. Take with 200 mcg tablet Qty: 30 tablet, Refills: 0     !! - Potential duplicate medications found. Please discuss with provider.    CONTINUE these medications which have NOT CHANGED   Details  amLODipine (NORVASC) 5 MG tablet Take 5 mg by mouth daily.    cyclobenzaprine (FLEXERIL) 10 MG tablet Take 10 mg by mouth 3 (three) times daily  as needed for muscle spasms.    FLUoxetine (PROZAC) 20 MG capsule Take 20 mg by mouth daily.    !! levothyroxine (SYNTHROID, LEVOTHROID) 200 MCG tablet Take 200 mcg by mouth daily before breakfast.    losartan-hydrochlorothiazide (HYZAAR) 100-25 MG per tablet Take 1 tablet by mouth daily.    metFORMIN (GLUCOPHAGE) 1000 MG tablet Take 1,000 mg by mouth 2 (two) times daily with a meal.    omeprazole (PRILOSEC) 40 MG capsule Take 40 mg by mouth daily.    oxyCODONE-acetaminophen (PERCOCET/ROXICET) 5-325 MG per tablet Take 1 tablet by mouth every 4 (four) hours as needed for  severe pain.     !! - Potential duplicate medications found. Please discuss with provider.     No Known Allergies Follow-up Information   Follow up with your doctor In 2 weeks.       The results of significant diagnostics from this hospitalization (including imaging, microbiology, ancillary and laboratory) are listed below for reference.    Significant Diagnostic Studies: Ct Head Wo Contrast  10/17/2014   CLINICAL DATA:  Headache  EXAM: CT HEAD WITHOUT CONTRAST  TECHNIQUE: Contiguous axial images were obtained from the base of the skull through the vertex without intravenous contrast.  COMPARISON:  None.  FINDINGS: Maintained gray-white differentiation. No CT evidence of an acute infarction. Mild hyperdense artifact along the periphery of the convexities. No intraparenchymal hemorrhage, mass, mass effect, or abnormal extra-axial fluid collection. The ventricles, cisterns, and sulci are normal in size, shape, and position for age. The visualized paranasal sinuses and mastoid air cells are predominantly clear.  IMPRESSION: No acute intracranial abnormality.   Electronically Signed   By: Jearld Lesch M.D.   On: 10/17/2014 04:45   Nm Myocar Multi W/spect W/wall Motion / Ef  10/17/2014   CLINICAL DATA:  Chest pain and hypertension.  EXAM: MYOCARDIAL IMAGING WITH SPECT (REST AND PHARMACOLOGIC-STRESS)  GATED LEFT VENTRICULAR WALL MOTION STUDY  LEFT VENTRICULAR EJECTION FRACTION  TECHNIQUE: Standard myocardial SPECT imaging was performed after resting intravenous injection of 10 mCi Tc-62m sestamibi. Subsequently, intravenous infusion of Lexiscan was performed under the supervision of the Cardiology staff. At peak effect of the drug, 30 mCi Tc-70m sestamibi was injected intravenously and standard myocardial SPECT imaging was performed. Quantitative gated imaging was also performed to evaluate left ventricular wall motion, and estimate left ventricular ejection fraction.  COMPARISON:  None.   FINDINGS: Perfusion: No decreased activity in the left ventricle on stress imaging to suggest reversible ischemia or infarction.  Wall Motion: Normal left ventricular wall motion. No left ventricular dilation.  Left Ventricular Ejection Fraction: 68 %  End diastolic volume 64 ml  End systolic volume 20 ml  IMPRESSION: 1. No reversible ischemia or infarction.  2. Normal left ventricular wall motion.  3. Left ventricular ejection fraction 68%  4. Low-risk stress test findings*.  *2012 Appropriate Use Criteria for Coronary Revascularization Focused Update: J Am Coll Cardiol. 2012;59(9):857-881. http://content.dementiazones.com.aspx?articleid=1201161   Electronically Signed   By: Loralie Champagne M.D.   On: 10/17/2014 15:29   Dg Chest Port 1 View  10/17/2014   CLINICAL DATA:  Mid chest pain  EXAM: PORTABLE CHEST - 1 VIEW  COMPARISON:  None.  FINDINGS: Heart size and mediastinal contours within normal range. Mild interstitial prominence at the lower lobes may be accentuated by overlying soft tissues. No focal consolidation. No pleural effusion or pneumothorax. Partially imaged cervical fusion hardware. No acute osseous finding.  IMPRESSION: Mild interstitial prominence may be accentuated by overlying soft  tissues versus atypical/viral infection or interstitial edema in the appropriate clinical setting.   Electronically Signed   By: Jearld LeschAndrew  DelGaizo M.D.   On: 10/17/2014 02:46   EKG NSr  Echo  - Left ventricle: The cavity size was normal. Wall thickness was increased in a pattern of mild LVH. Systolic function was vigorous. The estimated ejection fraction was in the range of 65% to 70%. Wall motion was normal; there were no regional wall motion abnormalities. Doppler parameters are consistent with abnormal left ventricular relaxation (grade 1 diastolic dysfunction). Microbiology: No results found for this or any previous visit (from the past 240 hour(s)).   Labs: Basic Metabolic  Panel:  Recent Labs Lab 10/17/14 0235 10/17/14 0448  NA 141 143  K 3.2* 3.4*  CL 99 100  CO2 27 26  GLUCOSE 180* 177*  BUN 9 10  CREATININE 0.96 0.96  CALCIUM 9.9 9.6  MG  --  1.8   Liver Function Tests:  Recent Labs Lab 10/17/14 0448  AST 22  ALT 21  ALKPHOS 85  BILITOT <0.2*  PROT 7.1  ALBUMIN 3.4*   No results found for this basename: LIPASE, AMYLASE,  in the last 168 hours No results found for this basename: AMMONIA,  in the last 168 hours CBC:  Recent Labs Lab 10/17/14 0235 10/17/14 0448  WBC 8.6 8.1  NEUTROABS  --  3.8  HGB 13.9 13.5  HCT 41.9 42.2  MCV 89.3 89.4  PLT 230 227   Cardiac Enzymes:  Recent Labs Lab 10/17/14 0448 10/17/14 1405  TROPONINI <0.30 <0.30   BNP: BNP (last 3 results)  Recent Labs  10/17/14 0448  PROBNP <5.0   CBG:  Recent Labs Lab 10/17/14 1350 10/17/14 1610  GLUCAP 188* 210*      Signed:  Dimitri Shakespeare L  [facility] 10/17/2014, 6:11 PM

## 2017-05-16 ENCOUNTER — Encounter (HOSPITAL_COMMUNITY): Payer: Self-pay | Admitting: Emergency Medicine

## 2017-05-16 ENCOUNTER — Emergency Department (HOSPITAL_COMMUNITY): Payer: Medicare (Managed Care)

## 2017-05-16 ENCOUNTER — Inpatient Hospital Stay (HOSPITAL_COMMUNITY)
Admission: EM | Admit: 2017-05-16 | Discharge: 2017-05-17 | DRG: 313 | Disposition: A | Discharge status: Home / Self Care | Payer: Medicare (Managed Care) | Attending: Family Medicine | Admitting: Family Medicine

## 2017-05-16 DIAGNOSIS — G8929 Other chronic pain: Secondary | ICD-10-CM | POA: Diagnosis present

## 2017-05-16 DIAGNOSIS — K449 Diaphragmatic hernia without obstruction or gangrene: Secondary | ICD-10-CM | POA: Diagnosis present

## 2017-05-16 DIAGNOSIS — Z6834 Body mass index (BMI) 34.0-34.9, adult: Secondary | ICD-10-CM

## 2017-05-16 DIAGNOSIS — F17210 Nicotine dependence, cigarettes, uncomplicated: Secondary | ICD-10-CM | POA: Diagnosis present

## 2017-05-16 DIAGNOSIS — G8194 Hemiplegia, unspecified affecting left nondominant side: Secondary | ICD-10-CM | POA: Diagnosis present

## 2017-05-16 DIAGNOSIS — R072 Precordial pain: Secondary | ICD-10-CM

## 2017-05-16 DIAGNOSIS — I10 Essential (primary) hypertension: Secondary | ICD-10-CM | POA: Diagnosis present

## 2017-05-16 DIAGNOSIS — M7989 Other specified soft tissue disorders: Secondary | ICD-10-CM | POA: Diagnosis present

## 2017-05-16 DIAGNOSIS — I360 Nonrheumatic tricuspid (valve) stenosis: Secondary | ICD-10-CM | POA: Diagnosis not present

## 2017-05-16 DIAGNOSIS — Z79899 Other long term (current) drug therapy: Secondary | ICD-10-CM

## 2017-05-16 DIAGNOSIS — E785 Hyperlipidemia, unspecified: Secondary | ICD-10-CM | POA: Diagnosis present

## 2017-05-16 DIAGNOSIS — E890 Postprocedural hypothyroidism: Secondary | ICD-10-CM | POA: Diagnosis present

## 2017-05-16 DIAGNOSIS — I259 Chronic ischemic heart disease, unspecified: Secondary | ICD-10-CM | POA: Diagnosis not present

## 2017-05-16 DIAGNOSIS — R079 Chest pain, unspecified: Secondary | ICD-10-CM | POA: Diagnosis not present

## 2017-05-16 DIAGNOSIS — E876 Hypokalemia: Secondary | ICD-10-CM | POA: Diagnosis present

## 2017-05-16 DIAGNOSIS — R0902 Hypoxemia: Secondary | ICD-10-CM | POA: Diagnosis present

## 2017-05-16 DIAGNOSIS — M549 Dorsalgia, unspecified: Secondary | ICD-10-CM | POA: Diagnosis present

## 2017-05-16 DIAGNOSIS — F419 Anxiety disorder, unspecified: Secondary | ICD-10-CM | POA: Diagnosis present

## 2017-05-16 DIAGNOSIS — R61 Generalized hyperhidrosis: Secondary | ICD-10-CM

## 2017-05-16 DIAGNOSIS — Z720 Tobacco use: Secondary | ICD-10-CM

## 2017-05-16 DIAGNOSIS — E669 Obesity, unspecified: Secondary | ICD-10-CM | POA: Diagnosis present

## 2017-05-16 DIAGNOSIS — E119 Type 2 diabetes mellitus without complications: Secondary | ICD-10-CM | POA: Diagnosis present

## 2017-05-16 HISTORY — DX: Headache, unspecified: R51.9

## 2017-05-16 HISTORY — DX: Headache: R51

## 2017-05-16 HISTORY — DX: Obesity, unspecified: E66.9

## 2017-05-16 LAB — HEPATIC FUNCTION PANEL
ALBUMIN: 3.3 g/dL — AB (ref 3.5–5.0)
ALT: 22 U/L (ref 14–54)
AST: 28 U/L (ref 15–41)
Alkaline Phosphatase: 76 U/L (ref 38–126)
Bilirubin, Direct: <0.1 mg/dL — ABNORMAL LOW (ref 0.1–0.5)
Total Bilirubin: 0.3 mg/dL (ref 0.3–1.2)
Total Protein: 6.3 g/dL — ABNORMAL LOW (ref 6.5–8.1)

## 2017-05-16 LAB — CBC
HEMATOCRIT: 38.6 % (ref 36.0–46.0)
HEMOGLOBIN: 12.3 g/dL (ref 12.0–15.0)
MCH: 28.3 pg (ref 26.0–34.0)
MCHC: 31.9 g/dL (ref 30.0–36.0)
MCV: 88.7 fL (ref 78.0–100.0)
PLATELETS: 222 10*3/uL (ref 150–400)
RBC: 4.35 MIL/uL (ref 3.87–5.11)
RDW: 14.1 % (ref 11.5–15.5)
WBC: 7.3 10*3/uL (ref 4.0–10.5)

## 2017-05-16 LAB — I-STAT TROPONIN, ED: Troponin i, poc: 0 ng/mL (ref 0.00–0.08)

## 2017-05-16 LAB — BASIC METABOLIC PANEL
ANION GAP: 7 (ref 5–15)
BUN: 7 mg/dL (ref 6–20)
CHLORIDE: 105 mmol/L (ref 101–111)
CO2: 28 mmol/L (ref 22–32)
CREATININE: 0.68 mg/dL (ref 0.44–1.00)
Calcium: 8.8 mg/dL — ABNORMAL LOW (ref 8.9–10.3)
GFR calc non Af Amer: >60 mL/min (ref >60)
Glucose, Bld: 107 mg/dL — ABNORMAL HIGH (ref 65–99)
POTASSIUM: 2.9 mmol/L — AB (ref 3.5–5.1)
SODIUM: 140 mmol/L (ref 135–145)

## 2017-05-16 LAB — BRAIN NATRIURETIC PEPTIDE: B Natriuretic Peptide: 9.1 pg/mL (ref 0.0–100.0)

## 2017-05-16 LAB — TROPONIN I: Troponin I: <0.03 ng/mL (ref <0.03)

## 2017-05-16 LAB — T4, FREE: Free T4: 1.07 ng/dL (ref 0.61–1.12)

## 2017-05-16 LAB — GLUCOSE, CAPILLARY
GLUCOSE-CAPILLARY: 179 mg/dL — AB (ref 65–99)
Glucose-Capillary: 141 mg/dL — ABNORMAL HIGH (ref 65–99)

## 2017-05-16 LAB — TSH: TSH: 1.388 u[IU]/mL (ref 0.350–4.500)

## 2017-05-16 LAB — MAGNESIUM: MAGNESIUM: 1.9 mg/dL (ref 1.7–2.4)

## 2017-05-16 LAB — PROTIME-INR
INR: 0.97
Prothrombin Time: 12.8 seconds (ref 11.4–15.2)

## 2017-05-16 MED ORDER — NITROGLYCERIN 0.4 MG SL SUBL
0.4000 mg | SUBLINGUAL_TABLET | SUBLINGUAL | Status: DC | PRN
Start: 2017-05-16 — End: 2017-05-17
  Filled 2017-05-16: qty 1

## 2017-05-16 MED ORDER — ONDANSETRON HCL 4 MG/2ML IJ SOLN: 4.0000 mg | Freq: Four times a day (QID) | INTRAMUSCULAR | Status: DC | PRN

## 2017-05-16 MED ORDER — NICOTINE 21 MG/24HR TD PT24
21.0000 mg | MEDICATED_PATCH | Freq: Every day | TRANSDERMAL | Status: DC
  Administered 2017-05-17: 21 mg via TRANSDERMAL
  Filled 2017-05-16 (×2): qty 1

## 2017-05-16 MED ORDER — HEPARIN SODIUM (PORCINE) 5000 UNIT/ML IJ SOLN
5000.0000 [IU] | Freq: Three times a day (TID) | INTRAMUSCULAR | Status: DC
  Administered 2017-05-16 – 2017-05-17 (×3): 5000 [IU] via SUBCUTANEOUS
  Filled 2017-05-16 (×3): qty 1

## 2017-05-16 MED ORDER — INSULIN ASPART 100 UNIT/ML ~~LOC~~ SOLN
0.0000 [IU] | Freq: Three times a day (TID) | SUBCUTANEOUS | Status: DC
  Administered 2017-05-17 (×2): 3 [IU] via SUBCUTANEOUS

## 2017-05-16 MED ORDER — MORPHINE SULFATE (PF) 4 MG/ML IV SOLN
4.0000 mg | Freq: Once | INTRAVENOUS | Status: AC
  Administered 2017-05-16: 4 mg via INTRAVENOUS
  Filled 2017-05-16: qty 1

## 2017-05-16 MED ORDER — GI COCKTAIL ~~LOC~~
30.0000 mL | Freq: Once | ORAL | Status: AC
Start: 2017-05-16 — End: 2017-05-16
  Administered 2017-05-16: 30 mL via ORAL
  Filled 2017-05-16: qty 30

## 2017-05-16 MED ORDER — LEVOTHYROXINE SODIUM 50 MCG PO TABS
250.0000 ug | ORAL_TABLET | Freq: Every day | ORAL | Status: DC
  Administered 2017-05-17: 07:00:00 250 ug via ORAL
  Filled 2017-05-16: qty 2

## 2017-05-16 MED ORDER — OXYCODONE-ACETAMINOPHEN 5-325 MG PO TABS
1.0000 | ORAL_TABLET | ORAL | Status: DC | PRN
  Administered 2017-05-16 – 2017-05-17 (×2): 1 via ORAL
  Filled 2017-05-16 (×2): qty 1

## 2017-05-16 MED ORDER — POTASSIUM CHLORIDE CRYS ER 20 MEQ PO TBCR
40.0000 meq | EXTENDED_RELEASE_TABLET | Freq: Once | ORAL | Status: AC
  Administered 2017-05-16: 40 meq via ORAL
  Filled 2017-05-16: qty 2

## 2017-05-16 MED ORDER — ASPIRIN 81 MG PO CHEW
324.0000 mg | CHEWABLE_TABLET | Freq: Once | ORAL | Status: AC
  Administered 2017-05-16: 324 mg via ORAL
  Filled 2017-05-16: qty 4

## 2017-05-16 MED ORDER — ASPIRIN EC 81 MG PO TBEC
81.0000 mg | DELAYED_RELEASE_TABLET | Freq: Every day | ORAL | Status: DC
  Administered 2017-05-17: 81 mg via ORAL
  Filled 2017-05-16 (×2): qty 1

## 2017-05-16 MED ORDER — NITROGLYCERIN 0.4 MG SL SUBL
0.4000 mg | SUBLINGUAL_TABLET | Freq: Once | SUBLINGUAL | Status: AC
  Administered 2017-05-16: 0.4 mg via SUBLINGUAL
  Filled 2017-05-16: qty 1

## 2017-05-16 MED ORDER — ATORVASTATIN CALCIUM 40 MG PO TABS
40.0000 mg | ORAL_TABLET | Freq: Every day | ORAL | Status: DC
  Administered 2017-05-16 – 2017-05-17 (×2): 40 mg via ORAL
  Filled 2017-05-16 (×2): qty 1

## 2017-05-16 MED ORDER — REGADENOSON 0.4 MG/5ML IV SOLN
0.4000 mg | Freq: Once | INTRAVENOUS | Status: AC
  Administered 2017-05-17: 0.4 mg via INTRAVENOUS
  Filled 2017-05-16: qty 5

## 2017-05-16 MED ORDER — AMLODIPINE BESYLATE 5 MG PO TABS
5.0000 mg | ORAL_TABLET | Freq: Every day | ORAL | Status: DC
  Administered 2017-05-16: 5 mg via ORAL
  Filled 2017-05-16: qty 1

## 2017-05-16 MED ORDER — ACETAMINOPHEN 325 MG PO TABS
650.0000 mg | ORAL_TABLET | ORAL | Status: DC | PRN
Start: 2017-05-16 — End: 2017-05-17
  Administered 2017-05-16 – 2017-05-17 (×2): 650 mg via ORAL
  Filled 2017-05-16 (×2): qty 2

## 2017-05-16 MED ORDER — CYCLOBENZAPRINE HCL 10 MG PO TABS: 10.0000 mg | ORAL_TABLET | Freq: Three times a day (TID) | ORAL | Status: DC | PRN

## 2017-05-16 MED ORDER — ACETAMINOPHEN 325 MG PO TABS
650.0000 mg | ORAL_TABLET | Freq: Once | ORAL | Status: AC
  Administered 2017-05-16: 650 mg via ORAL
  Filled 2017-05-16: qty 2

## 2017-05-16 MED ORDER — INSULIN ASPART 100 UNIT/ML ~~LOC~~ SOLN: 0.0000 [IU] | Freq: Every day | SUBCUTANEOUS | Status: DC

## 2017-05-16 MED ORDER — LOSARTAN POTASSIUM 50 MG PO TABS
100.0000 mg | ORAL_TABLET | Freq: Every day | ORAL | Status: DC
  Administered 2017-05-16 – 2017-05-17 (×2): 100 mg via ORAL
  Filled 2017-05-16 (×2): qty 2

## 2017-05-16 MED ORDER — HYDROCHLOROTHIAZIDE 25 MG PO TABS
25.0000 mg | ORAL_TABLET | Freq: Every day | ORAL | Status: DC
  Administered 2017-05-16 – 2017-05-17 (×2): 25 mg via ORAL
  Filled 2017-05-16 (×2): qty 1

## 2017-05-16 MED ORDER — FLUOXETINE HCL 20 MG PO CAPS
20.0000 mg | ORAL_CAPSULE | Freq: Every day | ORAL | Status: DC
Start: 2017-05-16 — End: 2017-05-17
  Administered 2017-05-16 – 2017-05-17 (×2): 20 mg via ORAL
  Filled 2017-05-16 (×2): qty 1

## 2017-05-16 NOTE — ED Notes (Signed)
Admitting physician at bedside

## 2017-05-16 NOTE — ED Notes (Signed)
Care handoff to Kaleigh RN 

## 2017-05-16 NOTE — ED Triage Notes (Addendum)
PT reports she was walking around at the store when she broke out into a sweat and began having substernal chest pain and pressure. PT reports pain radiated to right ear and right arm. PT had 2 SL nitro tablets in route and reports these improved pain slightly

## 2017-05-16 NOTE — ED Notes (Signed)
Admitting at the bedside.  

## 2017-05-16 NOTE — ED Notes (Signed)
Pt reports 4/10 chest pressure, declined NTG at this time due to 8/10 HA.

## 2017-05-16 NOTE — Consult Note (Signed)
CARDIOLOGY CONSULT NOTE     Primary Care Physician: Patient, No Pcp Per Referring Physician:  Dr Randolm IdolFletke  Admit Date: 05/16/2017  Reason for consultation:  Chest pain  Delaney MeigsBretta M Rosselli is a 71 y.o. female with a h/o obesity, HTN, DM, and arthritis who now presents with chest pain.  She reports that she had chest pain earlier today while pushing a cart in [employer] [facility] [employer].  Her symptoms lasted about 30 minutes and then improved with ntg.  She currently has mild chest heaviness.  She denies SOB.  She smokes but is willing to try to quite.  Today, she denies symptoms of palpitations, orthopnea, PND, lower extremity edema, dizziness, presyncope, syncope, or neurologic sequela. The patient is tolerating medications without difficulties and is otherwise without complaint today.   Past Medical History:  Diagnosis Date  . Arthritis   . Diabetes mellitus without complication (HCC)   . Graves disease   . Hypertension   . Obesity    Past Surgical History:  Procedure Laterality Date  . ABDOMINAL HYSTERECTOMY    . APPENDECTOMY    . CHOLECYSTECTOMY    . HERNIA REPAIR    . LUMBAR DISC SURGERY    . polyps colon    . THYROIDECTOMY    . TONSILLECTOMY      . amLODipine  5 mg Oral Daily  . [START ON 05/17/2017] aspirin EC  81 mg Oral Daily  . [START ON 05/17/2017] atorvastatin  40 mg Oral q1800  . FLUoxetine  20 mg Oral Daily  . heparin  5,000 Units Subcutaneous Q8H  . hydrochlorothiazide  25 mg Oral Daily  . [START ON 05/17/2017] insulin aspart  0-15 Units Subcutaneous TID WC  . insulin aspart  0-5 Units Subcutaneous QHS  . [START ON 05/17/2017] levothyroxine  250 mcg Oral QAC breakfast  . losartan  100 mg Oral Daily  . nicotine  21 mg Transdermal Daily  . [START ON 05/17/2017] regadenoson  0.4 mg Intravenous Once     Allergies  Allergen Reactions  . Aspirin Other (See Comments)    Irritates stomach    Social History   Social History  . Marital status: Single    Spouse name:  N/A  . Number of children: N/A  . Years of education: N/A   Occupational History  . Not on file.   Social History Main Topics  . Smoking status: Current Every Day Smoker    Packs/day: 0.25    Types: Cigarettes  . Smokeless tobacco: Never Used  . Alcohol use No  . Drug use: No  . Sexual activity: Not on file   Other Topics Concern  . Not on file   Social History Narrative   disabled    FH- + CAD (mother and brother)  ROS- All systems are reviewed and negative except as per the HPI above  Physical Exam: Telemetry: sinus rhythm Vitals:   05/16/17 1345 05/16/17 1415 05/16/17 1445 05/16/17 1718  BP: (!) 147/95 137/84 (!) 143/79 (!) 153/100  Pulse: 72 63 69 73  Resp: 12 14 16 14   Temp:    97.8 F (36.6 C)  TempSrc:    Oral  SpO2: 100% 99% 99% 99%  Weight:    210 lb 3.2 oz (95.3 kg)  Height:        GEN- The patient is overweight appearing, alert and oriented x 3 today.   Head- normocephalic, atraumatic Eyes-  Sclera clear, conjunctiva pink Ears- hearing intact Oropharynx- clear Neck- supple  Lungs-  Clear to ausculation bilaterally, normal work of breathing Heart- Regular rate and rhythm, no murmurs, rubs or gallops, PMI not laterally displaced GI- soft, NT, ND, + BS Extremities- no clubbing, cyanosis, or edema MS- no significant deformity or atrophy Skin- no rash or lesion Psych- euthymic mood, full affect Neuro- strength and sensation are intact  EKG tracing from today is personally reviewed and reveals sinus rhythm without ischemic changes, prolonged QT  Labs:   Lab Results  Component Value Date   WBC 7.3 05/16/2017   HGB 12.3 05/16/2017   HCT 38.6 05/16/2017   MCV 88.7 05/16/2017   PLT 222 05/16/2017     Recent Labs Lab 05/16/17 1308  NA 140  K 2.9*  CL 105  CO2 28  BUN 7  CREATININE 0.68  CALCIUM 8.8*  GLUCOSE 107*   Lab Results  Component Value Date   TROPONINI <0.30 10/17/2014   No results found for: CHOL No results found for:  HDL No results found for: LDLCALC No results found for: TRIG No results found for: CHOLHDL No results found for: LDLDIRECT    Radiology:  CXR today reveals no asdz  Echo from 2015 is reviewed and revealed normal ef  ASSESSMENT AND PLAN:   1. Chest pain Both typical and atypical features She has multiple CAD risk factors including FH, tobacco, DM, HTN, and sedentary lifestyle.  I would therefore advise further CV risk stratification.  She has been placed in observation by primary team.  Cycle CMs overnight.  If she does not have robust increase in CMs, would advise lexiscan myoview in am.  If she has worsening pain or robust increase in CMs, then consideration of cath is advised. Check fasting lipids  2. HTN Elevated Medicine titration while here  3. Tobacco Cessation is advised   Hillis Range, MD 05/16/2017  6:07 PM

## 2017-05-16 NOTE — ED Provider Notes (Signed)
[facility]-EMERGENCY DEPT Provider Note   CSN: [id number] Arrival date & time: 05/16/17  1257     History   Chief Complaint Chief Complaint  Patient presents with  . Chest Pain    HPI Joy Ryan is a 71 y.o. female with a PMHx of DM2, HTN, graves disease, and PSHx of abd hysterectomy, appendectomy, cholecystectomy, lumbar disc surgery, and thyroidectomy, who presents to the ED with complaints of sudden onset substernal chest pain that began about 40 minutes prior to arrival while she was walking around the store shopping. She describes the pain as initially 8-9/10 but currently 3-4/10 after receiving 2 SL NTG; stating it is constant pressure-like substernal chest pain that radiated into the right neck and shoulder area, worse with exertion, and improved with 2 sublingual nitroglycerin. She was not given any aspirin due to it causing upset stomach in the past. She reports associated diaphoresis. She admits to being a current cigarette smoker, approximately 1/4 pack per day for the last 40 years on and off. She is trying to quit. She does have a family history of DVT in her daughter and granddaughter, but no PMHx of this for herself. +FHx of CHF in mother, "defibrillator and heart problems" in her brother, and MI in her PGM, MGM, and an aunt. Chart review reveals she was admitted for CP work up in 09/2014 and had a low-risk myoview, an echo that showed grade 1 diastolic dysfunction but otherwise negative, and a negative exercise tolerance test. Her PCP is in [place]; she is here taking care of her mother. She does not have a PCP here.  She denies syncope, lightheadedness, fevers, chills, cough, SOB, unilateral LE swelling, recent travel/surgery/immobilization, estrogen use, new/worsening abd pain, N/V/D/C, hematuria, dysuria, myalgias, arthralgias, claudication, orthopnea, numbness, tingling, focal weakness, or any other complaints at this time.    The history is provided by the patient and  medical records. No language interpreter was used.  Chest Pain   This is a new problem. The current episode started less than 1 hour ago. The problem occurs constantly. The problem has been gradually improving. The pain is associated with exertion. The pain is present in the substernal region. The pain is at a severity of 8/10 (8-9/10 at onset; 3-4/10 now after 2 NTGs). The pain is moderate. The quality of the pain is described as exertional and pressure-like. The pain radiates to the right neck and right shoulder. Duration of episode(s) is 40 minutes. The symptoms are aggravated by exertion. Associated symptoms include diaphoresis. Pertinent negatives include no abdominal pain, no claudication, no cough, no fever, no lower extremity edema, no nausea, no numbness, no orthopnea, no shortness of breath, no syncope, no vomiting and no weakness. She has tried nitroglycerin for the symptoms. The treatment provided moderate relief. Risk factors include smoking/tobacco exposure and post-menopausal.  Her past medical history is significant for diabetes and hypertension.  Pertinent negatives for past medical history include no PE.  Her family medical history is significant for CAD.  Pertinent negatives for family medical history include: no PE.  Procedure history is positive for echocardiogram, stress echo and exercise treadmill test.    Past Medical History:  Diagnosis Date  . Arthritis   . Diabetes mellitus without complication (HCC)   . Graves disease   . Hypertension     Patient Active Problem List   Diagnosis Date Noted  . Chest pain 10/17/2014  . Headache 10/17/2014  . Type 2 diabetes mellitus (HCC) 10/17/2014  . HTN (hypertension)  10/17/2014  . Tobacco abuse 10/17/2014    Past Surgical History:  Procedure Laterality Date  . ABDOMINAL HYSTERECTOMY    . APPENDECTOMY    . CHOLECYSTECTOMY    . HERNIA REPAIR    . LUMBAR DISC SURGERY    . polyps colon    . THYROIDECTOMY    . TONSILLECTOMY       OB History    No data available       Home Medications    Prior to Admission medications   Medication Sig Start Date End Date Taking? Authorizing Provider  amLODipine (NORVASC) 5 MG tablet Take 5 mg by mouth daily.    [provider]  cyclobenzaprine (FLEXERIL) 10 MG tablet Take 10 mg by mouth 3 (three) times daily as needed for muscle spasms.    [provider]  diazepam (VALIUM) 10 MG tablet Take 0.5 tablets (5 mg total) by mouth every 12 (twelve) hours as needed for anxiety. 10/17/14   Christiane Ha, MD  FLUoxetine (PROZAC) 20 MG capsule Take 20 mg by mouth daily.    [provider]  levothyroxine (SYNTHROID, LEVOTHROID) 200 MCG tablet Take 200 mcg by mouth daily before breakfast.    [provider]  levothyroxine (SYNTHROID, LEVOTHROID) 25 MCG tablet Take 1 tablet (25 mcg total) by mouth daily before breakfast. Take with 200 mcg tablet 10/18/14   Christiane Ha, MD  losartan-hydrochlorothiazide (HYZAAR) 100-25 MG per tablet Take 1 tablet by mouth daily.    [provider]  metFORMIN (GLUCOPHAGE) 1000 MG tablet Take 1,000 mg by mouth 2 (two) times daily with a meal.    [provider]  omeprazole (PRILOSEC) 40 MG capsule Take 40 mg by mouth daily.    [provider]  oxyCODONE-acetaminophen (PERCOCET/ROXICET) 5-325 MG per tablet Take 1 tablet by mouth every 4 (four) hours as needed for severe pain.    [provider]    Family History No family history on file.  Social History Social History  Substance Use Topics  . Smoking status: Current Every Day Smoker    Packs/day: 0.25    Types: Cigarettes  . Smokeless tobacco: Never Used  . Alcohol use No     Allergies   Aspirin   Review of Systems Review of Systems  Constitutional: Positive for diaphoresis. Negative for chills and fever.  Respiratory: Negative for cough and shortness of breath.   Cardiovascular: Positive for chest pain.  Negative for orthopnea, claudication, leg swelling and syncope.  Gastrointestinal: Negative for abdominal pain, constipation, diarrhea, nausea and vomiting.  Genitourinary: Negative for dysuria and hematuria.  Musculoskeletal: Negative for arthralgias and myalgias.  Skin: Negative for color change.  Allergic/Immunologic: Positive for immunocompromised state (DM2).  Neurological: Negative for syncope, weakness, light-headedness and numbness.  Psychiatric/Behavioral: Negative for confusion.   All other systems reviewed and are negative for acute change except as noted in the HPI.    Physical Exam Updated Vital Signs BP (!) 146/87   Pulse 77   Temp 98.3 F (36.8 C) (Oral)   Resp 16   Ht 5\' 4"  (1.626 m)   Wt 90.3 kg (199 lb)   SpO2 97%   BMI 34.16 kg/m   Physical Exam  Constitutional: She is oriented to person, place, and time. Vital signs are normal. She appears well-developed and well-nourished.  Non-toxic appearance. No distress.  Afebrile, nontoxic, NAD  HENT:  Head: Normocephalic and atraumatic.  Mouth/Throat: Oropharynx is clear and moist and mucous membranes are normal.  Eyes: Conjunctivae and EOM are normal. Right eye exhibits no discharge. Left eye exhibits no discharge.  Neck: Normal range of motion. Neck supple.  Cardiovascular: Normal rate, regular rhythm, normal heart sounds and intact distal pulses.  Exam reveals no gallop and no friction rub.   No murmur heard. RRR, nl s1/s2, no m/r/g, distal pulses intact, no pitting pedal edema   Pulmonary/Chest: Effort normal and breath sounds normal. No respiratory distress. She has no decreased breath sounds. She has no wheezes. She has no rhonchi. She has no rales. She exhibits no tenderness, no crepitus, no deformity and no retraction.  CTAB in all lung fields, no w/r/r, no hypoxia or increased WOB, speaking in full sentences, SpO2 97% on RA  Chest wall nonTTP without crepitus, deformities, or retractions   Abdominal: Soft.  Normal appearance and bowel sounds are normal. She exhibits no distension. There is no tenderness. There is no rigidity, no rebound, no guarding, no CVA tenderness, no tenderness at McBurney's point and negative Murphy's sign.  Musculoskeletal: Normal range of motion.  MAE x4 Strength and sensation grossly intact in all extremities Distal pulses intact Gait steady No pitting pedal edema, neg homan's bilaterally   Neurological: She is alert and oriented to person, place, and time. She has normal strength. No sensory deficit.  Skin: Skin is warm, dry and intact. No rash noted.  Psychiatric: She has a normal mood and affect.  Nursing note and vitals reviewed.    ED Treatments / Results  Labs (all labs ordered are listed, but only abnormal results are displayed) Labs Reviewed  BASIC METABOLIC PANEL - Abnormal; Notable for the following:       Result Value   Potassium 2.9 (*)    Glucose, Bld 107 (*)    Calcium 8.8 (*)    All other components within normal limits  CBC  I-STAT TROPOININ, ED    EKG  EKG Interpretation  Date/Time:  Sunday May 16 2017 13:04:18 EDT Ventricular Rate:  77 PR Interval:    QRS Duration: 84 QT Interval:  444 QTC Calculation: 503 R Axis:   41 Text Interpretation:  Sinus rhythm Borderline T wave abnormalities Prolonged QT interval no significant change since 2015 Confirmed by Pricilla Loveless [phone]) on 05/16/2017 1:16:18 PM       Radiology Dg Chest 2 View  Result Date: 05/16/2017 CLINICAL DATA:  Chest pain and dry cough. EXAM: CHEST  2 VIEW COMPARISON:  None. FINDINGS: The heart size and mediastinal contours are within normal limits. Both lungs are clear. The visualized skeletal structures are unremarkable. IMPRESSION: No active cardiopulmonary disease. Electronically Signed   By: Gerome Sam III M.D   On: 05/16/2017 13:46    Echo 10/17/14: Study Conclusions: - Left ventricle: The cavity size was normal. Wall thickness was increased in a pattern  of mild LVH. Systolic function was vigorous. The estimated ejection fraction was in the range of 65% to 70%. Wall motion was normal; there were no regional wall motion abnormalities. Doppler parameters are consistent with abnormal left ventricular relaxation (grade 1 diastolic dysfunction).  NucMed Myoview 10/17/14: IMPRESSION: 1. No reversible ischemia or infarction. 2. Normal left ventricular wall motion. 3. Left ventricular ejection fraction 68% 4. Low-risk stress test findings*.  *2012 Appropriate Use Criteria for Coronary Revascularization Focused Update: J Am Coll Cardiol. 2012;59(9):857-881. http://content.dementiazones.com.aspx?articleid=1201161  Electronically Signed   By: Loralie Champagne M.D.   On: 10/17/2014 15:29  Exercise Tolerance test 10/17/14: Conclusions: Tolerated Lexiscan well. No arrythmia or chest pain. VSS.  L.Kilroy PA Confirmed by Graciela Husbands MD, Brett Canales [phone]) on 10/18/2014 5:58:51 PM   Procedures Procedures (including critical care time)  Medications Ordered in ED Medications  potassium chloride SA (K-DUR,KLOR-CON) CR tablet 40 mEq (not administered)  acetaminophen (TYLENOL) tablet 650 mg (not administered)  aspirin chewable tablet 324 mg (324 mg Oral Given 05/16/17 1357)  nitroGLYCERIN (NITROSTAT) SL tablet 0.4 mg (0.4 mg Sublingual Given 05/16/17 1359)  morphine 4 MG/ML injection 4 mg (4 mg Intravenous Given 05/16/17 1359)  gi cocktail (Maalox,Lidocaine,Donnatal) (30 mLs Oral Given 05/16/17 1359)     Initial Impression / Assessment and Plan / ED Course  I have reviewed the triage vital signs and the nursing notes.  Pertinent labs & imaging results that were available during my care of the patient were reviewed by me and considered in my medical decision making (see chart for details).     71 y.o. female here with sudden onset CP while walking in the store ~64mins PTA, with diaphoresis; given 2 NTG with improvement but still having mild  symptoms on arrival. On exam, no reproducible chest tenderness, no pitting edema or unilateral LE swelling, no tachycardia or hypoxia, overall well appearing. EKG without significant changes, no acute ischemic findings. CBC WNL, BMP and trop pending. CXR not yet done. Will get these done now, and give ASA with GI cocktail, morphine, and one more NTG to see if this improves her symptoms. She had a neg Echo and stress test as well as ETT in 2015, however today's story is slightly concerning for cardiac etiology. +Smoker, cessation encouraged. Will reassess shortly; may need admission.   2:26 PM BMP with low K 2.9, will replete now. Trop neg. CXR unremarkable.  Pt feeling better with regards to her CP, however has a headache from the NTG; morphine not helping, will give some tylenol now. Given concerning CP story, will proceed with admission. Discussed case with my attending Dr. Criss Alvine who agrees with plan.  2:47 PM Dr. Alanda Slim of Family Med residency service returning page and will admit. Holding orders to be placed by admitting team. Please see their notes for further documentation of care. I appreciate their help with this pleasant pt's care. Pt stable at time of admission.    Final Clinical Impressions(s) / ED Diagnoses   Final diagnoses:  Precordial chest pain  Diaphoresis  Hypokalemia  Hypertension, unspecified type  Tobacco user    New Prescriptions New Prescriptions   No medications on [address] [place], [place] 05/16/17 [address], [place], [place] 05/16/17 1546    Pricilla Loveless, MD 05/24/17 [phone]

## 2017-05-16 NOTE — ED Notes (Signed)
Attempted report x1. 

## 2017-05-16 NOTE — H&P (Signed)
Family Medicine Teaching [facility] Admission History and Physical Service Pager: [phone]  Patient name: Joy Ryan Lozada Medical record number: [id number] Date of birth: 05/06/1946 Age: 71 y.o. Gender: female  Primary Care Provider: Patient, No Pcp Per Consultants: Cardiology  Code Status: Full (obtained at admission)  Chief Complaint: Chest pain  Assessment and Plan: Joy Ryan Axtman is a 71 y.o. female presenting with chest pain . PMH is significant for hypertension, diabetes, tobacco use disorder, hypothyroidism/thyroidectomy, mood issues, chronic back pain.   Typical chest pain: substernal, exertional, relieved by NTG. HEART score-7 (highly suspicious history, age, > 3 risk factors, EKG changes). Wells score for PE 0 indicating low probability of PE. No signs of fluid overload on exam. CXR without infiltration or pleural edema. No fever or leukocytosis either. Was given aspirin 324 mg by EMS.  - Admit to FMTS, Dr. Randolm IdolFletke attending, on telemetry - Card consulted - Repeat ECG in AM (TW flattening in ED) - Cycle troponins (1st is negative) - Will check BNP although she appears euvolemic - Lipid panel, Hb A1c, TSH - Morphine 2 mg IV q2h prn chest pain  - Nitroglycerin 0.4mg  SL q25min prn chest pain - O2 by  prn hypoxemia - Echocardiogram - Aspirin and Lipitor  Hypokalemia: K2.9 ED. Was given K Dur 402. Wonder if this is due to HCTZ -Check BMP -Check Mg  Hypertension: Blood pressure 140s/80s in ED. She didn't take her blood pressure medications today. She is on losartan/HCTZ 100/25 mg daily, amlodipine 5 mg daily -Continue home meds -Will add BB as needed   Diabetes: not sure about her last A1c. She is on Janumet at home -Old, metformin -SSI-moderate for obesity -BG  ACHS -Check A1c  History of hypothyroidism/thyroidectomy. On Synthroid 250 mcg at home -Continue home Synthroid -TSH/free T4  Hyperlipidemia: takes pravastatin 20 mg at home -We'll start  atorvastatin -check LFT  Chronic back pain: Patient with multiple back surgery and resultant mild left hemiparesis and hemiplegia. Percocet, Flexeril and diazepam on her medication list. Hasn't taken any of these in over 2 weeks.  -Percocet as needed -Flexeril as needed -Hold diazepam   History of anxiety: on prozac as outpatient -continue home prozac.   Tobacco use disorder: Reports smoking about a pack a day. She is interested in quitting.  -Nicotine patch daily  FEN/GI: -Heart healthy/card modified   Prophylaxis: -Heparin subcutaneous incase she needs cath  Disposition: admit to telemetry to rule out ACS   History of Present Illness:  Joy Ryan Cockerill is a 71 y.o. female presenting with chest pain  Patient was at store shopping about 12 noon when she suddenly felt substernal chest pain. She describes the pain as pressure-like. Pain radiated to her right jaw and her right arm. Family member called EMS. She was given nitroglycerin and her pain improved. She also felt diaphoretic. She denies shortness of breath or nausea. Denies similar chest pain or history of CAD in the past. She felt kind of unusually tired since this morning. Otherwise, she denies any symptoms leading to this. Patient did not take her medications this morning. Patient moved here from [place] about 2 weeks ago to visit her mother.  Denies orthopnea, paroxysmal nocturnal dyspnea, fever, chills, nausea, vomiting, diarrhea, constipation, dysuria or new focal numbness or weakness. She admits headache after nitroglycerin. She has history of multiple back surgeries in the past and chronic left-sided weakness in upper and lower extremities. She also had history of Bell's palsy on the left. She reports slight swelling  to the ankle bilaterally but denies calf pain or swelling. Denies history of blood clots. Family history significant for brother with defibrillator in early 65s. Mother and father with heart or vascular disease in  1s and 73s.  She smokes about a pack a day. Denies alcohol use or recreational drug use.   ED course: Blood pressure mildly elevated to 140s/80s. BMP remarkable for potassium 2.9 otherwise within normal limits. Initial troponin negative. EKG significant for T-wave flattening in lateral and inferior leads. CBC and CXR within normal limits. she was given more nitroglycerin, morphine, lorazepam and d-dur in ED and  family medicine is called to admit patient for ACS rule out.  Review Of Systems:   ROS Review of systems negative except for pertinent positives and negatives in history of present illness above.   Patient Active Problem List   Diagnosis Date Noted  . Chest pain 10/17/2014  . Headache 10/17/2014  . Type 2 diabetes mellitus (HCC) 10/17/2014  . HTN (hypertension) 10/17/2014  . Tobacco abuse 10/17/2014    Past Medical History: Past Medical History:  Diagnosis Date  . Arthritis   . Diabetes mellitus without complication (HCC)   . Graves disease   . Hypertension   . Obesity     Past Surgical History: Past Surgical History:  Procedure Laterality Date  . ABDOMINAL HYSTERECTOMY    . APPENDECTOMY    . CHOLECYSTECTOMY    . HERNIA REPAIR    . LUMBAR DISC SURGERY    . polyps colon    . THYROIDECTOMY    . TONSILLECTOMY      Social History: Social History  Substance Use Topics  . Smoking status: Current Every Day Smoker    Packs/day: 0.25    Types: Cigarettes  . Smokeless tobacco: Never Used  . Alcohol use No   Additional social history: none  Please also refer to relevant sections of EMR.  Family History: No family history on file. (If not completed, MUST add something in)  Allergies and Medications: Allergies  Allergen Reactions  . Aspirin Other (See Comments)    Irritates stomach   No current facility-administered medications on file prior to encounter.    Current Outpatient Prescriptions on File Prior to Encounter  Medication Sig Dispense Refill  .  amLODipine (NORVASC) 5 MG tablet Take 5 mg by mouth daily.    . cyclobenzaprine (FLEXERIL) 10 MG tablet Take 10 mg by mouth 3 (three) times daily as needed for muscle spasms.    . diazepam (VALIUM) 10 MG tablet Take 0.5 tablets (5 mg total) by mouth every 12 (twelve) hours as needed for anxiety. (Patient taking differently: Take 10 mg by mouth at bedtime. ) 20 tablet 0  . FLUoxetine (PROZAC) 20 MG capsule Take 40 mg by mouth daily.     Marland Kitchen levothyroxine (SYNTHROID, LEVOTHROID) 200 MCG tablet Take 200 mcg by mouth daily before breakfast.    . levothyroxine (SYNTHROID, LEVOTHROID) 25 MCG tablet Take 1 tablet (25 mcg total) by mouth daily before breakfast. Take with 200 mcg tablet (Patient taking differently: Take 50 mcg by mouth daily before breakfast. ) 30 tablet 0  . losartan-hydrochlorothiazide (HYZAAR) 100-25 MG per tablet Take 1 tablet by mouth daily.    Marland Kitchen omeprazole (PRILOSEC) 40 MG capsule Take 40 mg by mouth daily.    Marland Kitchen oxyCODONE-acetaminophen (PERCOCET/ROXICET) 5-325 MG per tablet Take 1 tablet by mouth every 4 (four) hours as needed for severe pain.      Objective: BP (!) 153/100 (BP Location:  Right Arm)   Pulse 73   Temp 97.8 F (36.6 C) (Oral)   Resp 14   Ht 5\' 4"  (1.626 Ryan)   Wt 210 lb 3.2 oz (95.3 kg)   SpO2 99%   BMI 36.08 kg/Ryan  Exam: GEN: appears well, no apparent distress, able to sit up in bed easily. Head: normocephalic and atraumatic  Eyes: conjunctiva without injection, sclera anicteric, arcus senilis, mild left eye drooping (chronic) Oropharynx: mmm without erythema or exudation CVS: RRR, nl s1 & s2, no murmurs, trace edema RESP: speaks in full sentence, no IWOB, good air movement bilaterally, CTAB GI: BS present & normal, soft, NTND GU: no suprapubic or CVA tenderness MSK: no focal tenderness. Cystic swelling over her left medial malleolus (chronic) SKIN: no apparent skin lesion NEURO: awake, alert and oiented appropriately, mild left eye drooping (chronic), motor  4+/5 in left upper and lower extremities (chronic), mildly diminished sensation in left upper and lower extremities (chronic). Otherwise, neuro exam basically normal.  PSYCH: euthymic mood with congruent affect  Labs and Imaging: CBC BMET   Recent Labs Lab 05/16/17 1308  WBC 7.3  HGB 12.3  HCT 38.6  PLT 222    Recent Labs Lab 05/16/17 1308  NA 140  K 2.9*  CL 105  CO2 28  BUN 7  CREATININE 0.68  GLUCOSE 107*  CALCIUM 8.8*     Dg Chest 2 View  Result Date: 05/16/2017 CLINICAL DATA:  Chest pain and dry cough. EXAM: CHEST  2 VIEW COMPARISON:  None. FINDINGS: The heart size and mediastinal contours are within normal limits. Both lungs are clear. The visualized skeletal structures are unremarkable. IMPRESSION: No active cardiopulmonary disease. Electronically Signed   By: Gerome Sam III Ryan.D   On: 05/16/2017 13:46    Almon Hercules, MD 05/16/2017, 6:10 PM PGY-2, [facility] FPTS Intern pager: [phone], text pages welcome

## 2017-05-17 ENCOUNTER — Encounter (HOSPITAL_COMMUNITY): Payer: Self-pay | Admitting: General Practice

## 2017-05-17 ENCOUNTER — Inpatient Hospital Stay (HOSPITAL_COMMUNITY): Payer: Medicare (Managed Care)

## 2017-05-17 DIAGNOSIS — R079 Chest pain, unspecified: Secondary | ICD-10-CM

## 2017-05-17 DIAGNOSIS — I360 Nonrheumatic tricuspid (valve) stenosis: Secondary | ICD-10-CM

## 2017-05-17 DIAGNOSIS — E876 Hypokalemia: Secondary | ICD-10-CM

## 2017-05-17 DIAGNOSIS — I10 Essential (primary) hypertension: Secondary | ICD-10-CM

## 2017-05-17 LAB — BASIC METABOLIC PANEL
ANION GAP: 7 (ref 5–15)
ANION GAP: 8 (ref 5–15)
BUN: 8 mg/dL (ref 6–20)
BUN: 7 mg/dL (ref 6–20)
CHLORIDE: 104 mmol/L (ref 101–111)
CO2: 28 mmol/L (ref 22–32)
CO2: 28 mmol/L (ref 22–32)
Calcium: 8.7 mg/dL — ABNORMAL LOW (ref 8.9–10.3)
Calcium: 8.9 mg/dL (ref 8.9–10.3)
Chloride: 105 mmol/L (ref 101–111)
Creatinine, Ser: 0.65 mg/dL (ref 0.44–1.00)
Creatinine, Ser: 0.72 mg/dL (ref 0.44–1.00)
GFR calc Af Amer: >60 mL/min (ref >60)
GFR calc non Af Amer: >60 mL/min (ref >60)
Glucose, Bld: 167 mg/dL — ABNORMAL HIGH (ref 65–99)
Glucose, Bld: 219 mg/dL — ABNORMAL HIGH (ref 65–99)
POTASSIUM: 2.9 mmol/L — AB (ref 3.5–5.1)
POTASSIUM: 3.5 mmol/L (ref 3.5–5.1)
SODIUM: 140 mmol/L (ref 135–145)
Sodium: 140 mmol/L (ref 135–145)

## 2017-05-17 LAB — GLUCOSE, CAPILLARY
GLUCOSE-CAPILLARY: 184 mg/dL — AB (ref 65–99)
Glucose-Capillary: 153 mg/dL — ABNORMAL HIGH (ref 65–99)
Glucose-Capillary: 171 mg/dL — ABNORMAL HIGH (ref 65–99)

## 2017-05-17 LAB — CBC
HEMATOCRIT: 36.9 % (ref 36.0–46.0)
HEMOGLOBIN: 11.5 g/dL — AB (ref 12.0–15.0)
MCH: 27.9 pg (ref 26.0–34.0)
MCHC: 31.2 g/dL (ref 30.0–36.0)
MCV: 89.6 fL (ref 78.0–100.0)
Platelets: 198 10*3/uL (ref 150–400)
RBC: 4.12 MIL/uL (ref 3.87–5.11)
RDW: 14.4 % (ref 11.5–15.5)
WBC: 5.6 10*3/uL (ref 4.0–10.5)

## 2017-05-17 LAB — NM MYOCAR MULTI W/SPECT W/WALL MOTION / EF
CSEPPHR: 91 {beats}/min
MPHR: 151 {beats}/min
Percent HR: 60 %
Rest HR: 52 {beats}/min

## 2017-05-17 LAB — LIPID PANEL
CHOLESTEROL: 141 mg/dL (ref 0–200)
HDL: 35 mg/dL — ABNORMAL LOW (ref >40)
LDL Cholesterol: 70 mg/dL (ref 0–99)
TRIGLYCERIDES: 180 mg/dL — AB (ref <150)
Total CHOL/HDL Ratio: 4 RATIO
VLDL: 36 mg/dL (ref 0–40)

## 2017-05-17 LAB — TROPONIN I: Troponin I: <0.03 ng/mL (ref <0.03)

## 2017-05-17 LAB — ECHOCARDIOGRAM COMPLETE
HEIGHTINCHES: 64 in
Weight: 3305.6 oz

## 2017-05-17 MED ORDER — PANTOPRAZOLE SODIUM 40 MG PO TBEC
40.0000 mg | DELAYED_RELEASE_TABLET | Freq: Every day | ORAL | Status: DC
  Administered 2017-05-17: 40 mg via ORAL
  Filled 2017-05-17: qty 1

## 2017-05-17 MED ORDER — TECHNETIUM TC 99M TETROFOSMIN IV KIT: 10.0000 | PACK | Freq: Once | INTRAVENOUS | Status: DC | PRN

## 2017-05-17 MED ORDER — REGADENOSON 0.4 MG/5ML IV SOLN
INTRAVENOUS | Status: AC
  Filled 2017-05-17: qty 5

## 2017-05-17 MED ORDER — AMLODIPINE BESYLATE 5 MG PO TABS: 5.0000 mg | ORAL_TABLET | Freq: Two times a day (BID) | ORAL | 0 refills | Status: AC

## 2017-05-17 MED ORDER — POTASSIUM CHLORIDE ER 20 MEQ PO TBCR: 10.0000 meq | EXTENDED_RELEASE_TABLET | Freq: Every day | ORAL | 0 refills | Status: AC

## 2017-05-17 MED ORDER — TECHNETIUM TC 99M TETROFOSMIN IV KIT
10.0000 | PACK | Freq: Once | INTRAVENOUS | Status: AC | PRN
  Administered 2017-05-17: 10 via INTRAVENOUS

## 2017-05-17 MED ORDER — AMLODIPINE BESYLATE 5 MG PO TABS
5.0000 mg | ORAL_TABLET | Freq: Two times a day (BID) | ORAL | Status: DC
  Administered 2017-05-17: 5 mg via ORAL
  Filled 2017-05-17: qty 1

## 2017-05-17 MED ORDER — POTASSIUM CHLORIDE CRYS ER 20 MEQ PO TBCR
40.0000 meq | EXTENDED_RELEASE_TABLET | ORAL | Status: AC
  Administered 2017-05-17 (×3): 40 meq via ORAL
  Filled 2017-05-17 (×3): qty 2

## 2017-05-17 MED ORDER — TECHNETIUM TC 99M TETROFOSMIN IV KIT
30.0000 | PACK | Freq: Once | INTRAVENOUS | Status: AC | PRN
  Administered 2017-05-17: 30 via INTRAVENOUS

## 2017-05-17 MED ORDER — ATORVASTATIN CALCIUM 40 MG PO TABS: 40.0000 mg | ORAL_TABLET | Freq: Every day | ORAL | 0 refills | Status: AC

## 2017-05-17 NOTE — Progress Notes (Signed)
     The patient was seen in nuclear medicine for a lexiscan myoview. She tolerated the procedure well. No acute ST or TW changes on ECG. Await nuclear images.    Allin Frix Stern PA-C  [facility]     

## 2017-05-17 NOTE — Discharge Instructions (Signed)
If you wish to quit smoking, help is available.  For free tobacco cessation program offerings call the [facility] at [phone] or Live Well Line at [phone]. You may also visit [email or web address] or email [email or web address]@ [email or web address]  for more information on other programs.  Steps to Quit Smoking Smoking tobacco can be bad for your health. It can also affect almost every organ in your body. Smoking puts you and people around you at risk for many serious long-lasting (chronic) diseases. Quitting smoking is hard, but it is one of the best things that you can do for your health. It is never too late to quit. What are the benefits of quitting smoking? When you quit smoking, you lower your risk for getting serious diseases and conditions. They can include:  Lung cancer or lung disease.  Heart disease.  Stroke.  Heart attack.  Not being able to have children (infertility).  Weak bones (osteoporosis) and broken bones (fractures). If you have coughing, wheezing, and shortness of breath, those symptoms may get better when you quit. You may also get sick less often. If you are pregnant, quitting smoking can help to lower your chances of having a baby of low birth weight. What can I do to help me quit smoking? Talk with your doctor about what can help you quit smoking. Some things you can do (strategies) include:  Quitting smoking totally, instead of slowly cutting back how much you smoke over a period of time.  Going to in-person counseling. You are more likely to quit if you go to many counseling sessions.  Using resources and support systems, such as:  Online chats with a Veterinary surgeoncounselor.  Phone quitlines.  Printed Materials engineerself-help materials.  Support groups or group counseling.  Text messaging programs.  Mobile phone apps or applications.  Taking medicines. Some of these medicines may have nicotine in them. If you are pregnant or breastfeeding, do not take any medicines  to quit smoking unless your doctor says it is okay. Talk with your doctor about counseling or other things that can help you. Talk with your doctor about using more than one strategy at the same time, such as taking medicines while you are also going to in-person counseling. This can help make quitting easier. What things can I do to make it easier to quit? Quitting smoking might feel very hard at first, but there is a lot that you can do to make it easier. Take these steps:  Talk to your family and friends. Ask them to support and encourage you.  Call phone quitlines, reach out to support groups, or work with a Veterinary surgeoncounselor.  Ask people who smoke to not smoke around you.  Avoid places that make you want (trigger) to smoke, such as:  Bars.  Parties.  Smoke-break areas at work.  Spend time with people who do not smoke.  Lower the stress in your life. Stress can make you want to smoke. Try these things to help your stress:  Getting regular exercise.  Deep-breathing exercises.  Yoga.  Meditating.  Doing a body scan. To do this, close your eyes, focus on one area of your body at a time from head to toe, and notice which parts of your body are tense. Try to relax the muscles in those areas.  Download or buy apps on your mobile phone or tablet that can help you stick to your quit plan. There are many free apps, such as QuitGuide from the [employer] developer(Centers for Disease Control  and Prevention). You can find more support from smokefree.gov and other websites. This information is not intended to replace advice given to you by your health care provider. Make sure you discuss any questions you have with your health care provider. Document Released: 10/03/2009 Document Revised: 08/04/2016 Document Reviewed: 04/23/2015 Elsevier Interactive Patient Education  2017 [employer]. It does not look like your chest pain as associated with your heart. Your potassium was noted to be low at 2.9, but improved  to 3.5 (low normal) with supplementation here in the hospital I have given you a prescription for potassium tablets, take (1 tablet) daily until you follow up with you PCP in 1-2 weeks. The hydrochlorothiazide may be causing your low potassium, however I will leave it up to your PCP on whether she/he feels this should be changed in the future. Because your blood pressure was high, start taking amlodipine 5mg  twice daily instead of daily.  The cardiologist switched you to a stronger cholesterol medication called Lipitor (atorvastatin). You can STOP pravastatin.  Continue with your acid reflux medication as your hiatal hernia may be causing you issues; could consider having this assess as an outpatient by your PCP and/or a surgeon.   Here is some information that may be helpful to your PCP:  The echocardiogram on 05/17/17 revealed the following: Left ventricle: The cavity size was normal. Wall thickness was   increased in a pattern of mild LVH. There was focal basal   hypertrophy. Systolic function was normal. The estimated ejection   fraction was in the range of 55% to 60%. Wall motion was normal;   there were no regional wall motion abnormalities. Features are   consistent with a pseudonormal left ventricular filling pattern,   with concomitant abnormal relaxation and increased filling   pressure (grade 2 diastolic dysfunction). - Pulmonary arteries: Systolic pressure was mildly increased. PA   peak pressure: 38 mm Hg (S).  The Myoview stress test revealed: 1. No reversible ischemia or infarction.  2. Lateral wall hypokinesis. Otherwise normal left ventricular wall motion.  3. Left ventricular ejection fraction 57%  4. Non invasive risk stratification*: Low    Nonspecific Chest Pain Chest pain can be caused by many different conditions. There is a chance that your pain could be related to something serious, such as a heart attack or a blood clot in your lungs. Chest pain  can also be caused by conditions that are not life-threatening. If you have chest pain, it is very important to follow up with your doctor. Follow these instructions at home: Medicines   If you were prescribed an antibiotic medicine, take it as told by your doctor. Do not stop taking the antibiotic even if you start to feel better.  Take over-the-counter and prescription medicines only as told by your doctor. Lifestyle   Do not use any products that contain nicotine or tobacco, such as cigarettes and e-cigarettes. If you need help quitting, ask your doctor.  Do not drink alcohol.  Make lifestyle changes as told by your doctor. These may include:  Getting regular exercise. Ask your doctor for some activities that are safe for you.  Eating a heart-healthy diet. A diet specialist (dietitian) can help you to learn healthy eating options.  Staying at a healthy weight.  Managing diabetes, if needed.  Lowering your stress, as with deep breathing or spending time in nature. General instructions   Avoid any activities that make you feel chest pain.  If your chest pain is  because of heartburn:  Raise (elevate) the head of your bed about 6 inches (15 cm). You can do this by putting blocks under the bed legs at the head of the bed.  Do not sleep with extra pillows under your head. That does not help heartburn.  Keep all follow-up visits as told by your doctor. This is important. This includes any further testing if your chest pain does not go away. Contact a doctor if:  Your chest pain does not go away.  You have a rash with blisters on your chest.  You have a fever.  You have chills. Get help right away if:  Your chest pain is worse.  You have a cough that gets worse, or you cough up blood.  You have very bad (severe) pain in your belly (abdomen).  You are very weak.  You pass out (faint).  You have either of these for no clear reason:  Sudden chest  discomfort.  Sudden discomfort in your arms, back, neck, or jaw.  You have shortness of breath at any time.  You suddenly start to sweat, or your skin gets clammy.  You feel sick to your stomach (nauseous).  You throw up (vomit).  You suddenly feel light-headed or dizzy.  Your heart starts to beat fast, or it feels like it is skipping beats. These symptoms may be an emergency. Do not wait to see if the symptoms will go away. Get medical help right away. Call your local emergency services (911 in the [place] Do not drive yourself to the hospital. This information is not intended to replace advice given to you by your health care provider. Make sure you discuss any questions you have with your health care provider. Document Released: 05/25/2008 Document Revised: 08/31/2016 Document Reviewed: 08/31/2016 Elsevier Interactive Patient Education  2017 [employer].

## 2017-05-17 NOTE — Discharge Summary (Signed)
Family Medicine Teaching [facility] Discharge Summary  Patient name: Joy Ryan Medical record number: [id number] Date of birth: January 10, 1946 Age: 71 y.o. Gender: female Date of Admission: 05/16/2017  Date of Discharge: 05/17/17 Admitting Physician: Uvaldo Rising, MD  Primary Care Provider: Patient, No Pcp Per PCP in [place] Consultants: cardiology  Indication for Hospitalization:  Chest pain  Discharge Diagnoses/Problem List:  Chest pain unspecified Hypertension Hypokalemia Hyperlipidemia  Hypothyroidism  Chronic back pain  History of anxiety Tobacco use disorder Diabetes mellitus, type 2  Disposition: home   Discharge Condition: stable, improved   Brief Hospital Course:  Joy Ryan is a 71 y/o presenting for substernal chest pressure with radiation to the right jaw and arm while shopping which resolved by nitroglycerin given by EMS. Wells score 0 making PE unlikely.  Given significant risk factors and T wave flattening on EKG in the lateral and inferior leads, cardiology was consulted. She underwent a myoview stress test that was low risk. Echo revealed an  EF of 55-60%, grade 2 diastolic dysfunction, and mildly increased PA peak pressure to . The patient was euvolemic on exam. Suspect patient's hiatal hernia may have contributed to her symptoms. Chest pain had resolved on discharge.   Additionally, she was found to have a potassium of 2.9 on admission. It improved after repletion.   Issues for Follow Up:  1. Follow up potassium (was given KDUR to take daily until follow up), consider discontinuation of HCTZ if she is consistently hypokalemic  2. Follow up to see if she has any other chest pain recurrences.  3. Follow up BP- amlodipine increased to 5mg  BID, if tolerating can go to 10mg  daily  Significant Procedures:  Myoview 5/28: No reversible ischemia or infarction. Lateral wall hypokinesis. Otherwise normal left ventricular wall Motion. Left ventricular  ejection fraction 57%  Non invasive risk stratification*: Low   Significant Labs and Imaging:   Recent Labs Lab 05/16/17 1308 05/17/17 0509  WBC 7.3 5.6  HGB 12.3 11.5*  HCT 38.6 36.9  PLT 222 198    Recent Labs Lab 05/16/17 1308 05/16/17 1754 05/17/17 0509 05/17/17 1541  NA 140  --  140 140  K 2.9*  --  2.9* 3.5  CL 105  --  105 104  CO2 28  --  28 28  GLUCOSE 107*  --  167* 219*  BUN 7  --  8 7  CREATININE 0.68  --  0.65 0.72  CALCIUM 8.8*  --  8.7* 8.9  MG  --  1.9  --   --   ALKPHOS  --  76  --   --   AST  --  28  --   --   ALT  --  22  --   --   ALBUMIN  --  3.3*  --   --    Risk Stratification Labs  TSH    Component Value Date/Time   TSH 1.388 05/16/2017 1754   TSH 16.140 (H) 10/17/2014 0448   Lipid Panel     Component Value Date/Time   CHOL 141 05/17/2017 0509   TRIG 180 (H) 05/17/2017 0509   HDL 35 (L) 05/17/2017 0509   CHOLHDL 4.0 05/17/2017 0509   VLDL 36 05/17/2017 0509   LDLCALC 70 05/17/2017 0509    Dg Chest 2 View  Result Date: 05/16/2017 CLINICAL DATA:  Chest pain and dry cough. EXAM: CHEST  2 VIEW COMPARISON:  None. FINDINGS: The heart size and mediastinal contours are within  normal limits. Both lungs are clear. The visualized skeletal structures are unremarkable. IMPRESSION: No active cardiopulmonary disease. Electronically Signed   By: Joy Ryan M.D   On: 05/16/2017 13:46   Nm Myocar Multi W/spect Izetta Dakin Motion / Ef  Result Date: 05/17/2017 CLINICAL DATA:  Chest pain.  Smoker.  Diabetes and hypertension. EXAM: MYOCARDIAL IMAGING WITH SPECT (REST AND PHARMACOLOGIC-STRESS) GATED LEFT VENTRICULAR WALL MOTION STUDY LEFT VENTRICULAR EJECTION FRACTION TECHNIQUE: Standard myocardial SPECT imaging was performed after resting intravenous injection of 10 mCi Tc-84m tetrofosmin. Subsequently, intravenous infusion of Lexiscan was performed under the supervision of the Cardiology staff. At peak effect of the drug, 30 mCi Tc-71m tetrofosmin  was injected intravenously and standard myocardial SPECT imaging was performed. Quantitative gated imaging was also performed to evaluate left ventricular wall motion, and estimate left ventricular ejection fraction. COMPARISON:  10/17/2014 FINDINGS: Perfusion: No decreased activity in the left ventricle on stress imaging to suggest reversible ischemia or infarction. Wall Motion: Lateral wall hypokinesis noted. Left Ventricular Ejection Fraction: 57 % End diastolic volume 93 ml End systolic volume 40 ml IMPRESSION: 1. No reversible ischemia or infarction. 2. Lateral wall hypokinesis. Otherwise normal left ventricular wall motion. 3. Left ventricular ejection fraction 57% 4. Non invasive risk stratification*: Low *2012 Appropriate Use Criteria for Coronary Revascularization Focused Update: J Am Coll Cardiol. 2012;59(9):857-881. http://content.dementiazones.com.aspx?articleid=[email or web address] Electronically Signed   By: Signa Kell M.D.   On: 05/17/2017 14:00    Results/Tests Pending at Time of Discharge: A1c   Discharge Medications:  Allergies as of 05/17/2017      Reactions   Aspirin Other (See Comments)   Irritates stomach      Medication List    STOP taking these medications   pravastatin 20 MG tablet Commonly known as:  PRAVACHOL     TAKE these medications   amLODipine 5 MG tablet Commonly known as:  NORVASC Take 1 tablet (5 mg total) by mouth 2 (two) times daily. What changed:  when to take this   atorvastatin 40 MG tablet Commonly known as:  LIPITOR Take 1 tablet (40 mg total) by mouth daily at 6 PM. Start taking on:  05/18/2017   cyclobenzaprine 10 MG tablet Commonly known as:  FLEXERIL Take 10 mg by mouth 3 (three) times daily as needed for muscle spasms.   diazepam 10 MG tablet Commonly known as:  VALIUM Take 0.5 tablets (5 mg total) by mouth every 12 (twelve) hours as needed for anxiety. What changed:  how much to take  when to take this   FLUoxetine 20 MG  capsule Commonly known as:  PROZAC Take 40 mg by mouth daily.   levothyroxine 200 MCG tablet Commonly known as:  SYNTHROID, LEVOTHROID Take 200 mcg by mouth daily before breakfast. What changed:  Another medication with the same name was changed. Make sure you understand how and when to take each.   levothyroxine 25 MCG tablet Commonly known as:  SYNTHROID, LEVOTHROID Take 1 tablet (25 mcg total) by mouth daily before breakfast. Take with 200 mcg tablet What changed:  how much to take  additional instructions   losartan-hydrochlorothiazide 100-25 MG tablet Commonly known as:  HYZAAR Take 1 tablet by mouth daily.   omeprazole 40 MG capsule Commonly known as:  PRILOSEC Take 40 mg by mouth daily.   oxyCODONE-acetaminophen 5-325 MG tablet Commonly known as:  PERCOCET/ROXICET Take 1 tablet by mouth every 4 (four) hours as needed for severe pain.   Potassium Chloride ER 20 MEQ Tbcr Take 10  mEq by mouth daily.   sitaGLIPtin-metformin 50-1000 MG tablet Commonly known as:  JANUMET Take 1 tablet by mouth 2 (two) times daily with a meal.   VISINE OP Place 1 drop into both eyes daily as needed (dry eyes).       Discharge Instructions: Please refer to Patient Instructions section of EMR for full details.  Patient was counseled important signs and symptoms that should prompt return to medical care, changes in medications, dietary instructions, activity restrictions, and follow up appointments.   Follow-Up Appointments:   Follow up with PCP in [place] in 1-2 weeks  Joanna Pufforsey, Crystal S, MD 05/17/2017, 5:58 PM PGY-3, [facility]

## 2017-05-17 NOTE — Progress Notes (Signed)
Progress Note  Patient Name: Joy Ryan Date of Encounter: 05/17/2017  Primary Cardiologist: New  Subjective   Pt complains of chest pressure that comes and goes  Nt pleuritic  Breathing is OK    Inpatient Medications    Scheduled Meds: . amLODipine  5 mg Oral Daily  . aspirin EC  81 mg Oral Daily  . atorvastatin  40 mg Oral q1800  . FLUoxetine  20 mg Oral Daily  . heparin  5,000 Units Subcutaneous Q8H  . hydrochlorothiazide  25 mg Oral Daily  . insulin aspart  0-15 Units Subcutaneous TID WC  . insulin aspart  0-5 Units Subcutaneous QHS  . levothyroxine  250 mcg Oral QAC breakfast  . losartan  100 mg Oral Daily  . nicotine  21 mg Transdermal Daily  . potassium chloride  40 mEq Oral Q3H  . regadenoson  0.4 mg Intravenous Once   Continuous Infusions:  PRN Meds: acetaminophen, cyclobenzaprine, nitroGLYCERIN, ondansetron (ZOFRAN) IV, oxyCODONE-acetaminophen   Vital Signs    Vitals:   05/16/17 1445 05/16/17 1718 05/16/17 2156 05/17/17 0449  BP: (!) 143/79 (!) 153/100 114/84 (!) 126/97  Pulse: 69 73 78 68  Resp: 16 14 15 16   Temp:  97.8 F (36.6 C) 97.9 F (36.6 C) 98.2 F (36.8 C)  TempSrc:  Oral Oral Oral  SpO2: 99% 99% 98% 98%  Weight:  95.3 kg (210 lb 3.2 oz)  93.7 kg (206 lb 9.6 oz)  Height:        Intake/Output Summary (Last 24 hours) at 05/17/17 0801 Last data filed at 05/17/17 0450  Gross per 24 hour  Intake              240 ml  Output              100 ml  Net              140 ml   Filed Weights   05/16/17 1301 05/16/17 1718 05/17/17 0449  Weight: 90.3 kg (199 lb) 95.3 kg (210 lb 3.2 oz) 93.7 kg (206 lb 9.6 oz)    Telemetry    SR   - Personally Reviewed  ECG      Physical Exam   GEN: No acute distress.   Neck: No JVD Cardiac: RRR, no murmurs, rubs, or gallops.  Respiratory: Clear to auscultation bilaterally. GI: Soft, nontender, non-distended  MS: No edema; No deformity. Neuro:  Nonfocal  Psych: Normal affect   Labs      Chemistry Recent Labs Lab 05/16/17 1308 05/16/17 1754 05/17/17 0509  NA 140  --  140  K 2.9*  --  2.9*  CL 105  --  105  CO2 28  --  28  GLUCOSE 107*  --  167*  BUN 7  --  8  CREATININE 0.68  --  0.65  CALCIUM 8.8*  --  8.7*  PROT  --  6.3*  --   ALBUMIN  --  3.3*  --   AST  --  28  --   ALT  --  22  --   ALKPHOS  --  76  --   BILITOT  --  0.3  --   GFRNONAA >60  --  >60  GFRAA >60  --  >60  ANIONGAP 7  --  7     Hematology Recent Labs Lab 05/16/17 1308 05/17/17 0509  WBC 7.3 5.6  RBC 4.35 4.12  HGB 12.3 11.5*  HCT 38.6  36.9  MCV 88.7 89.6  MCH 28.3 27.9  MCHC 31.9 31.2  RDW 14.1 14.4  PLT 222 198    Cardiac Enzymes Recent Labs Lab 05/16/17 1754 05/16/17 2301 05/17/17 0509  TROPONINI <0.03 <0.03 <0.03    Recent Labs Lab 05/16/17 1324  TROPIPOC 0.00     BNP Recent Labs Lab 05/16/17 1308  BNP 9.1     DDimer No results for input(s): DDIMER in the last 168 hours.   Radiology    Dg Chest 2 View  Result Date: 05/16/2017 CLINICAL DATA:  Chest pain and dry cough. EXAM: CHEST  2 VIEW COMPARISON:  None. FINDINGS: The heart size and mediastinal contours are within normal limits. Both lungs are clear. The visualized skeletal structures are unremarkable. IMPRESSION: No active cardiopulmonary disease. Electronically Signed   By: Gerome Sam III M.D   On: 05/16/2017 13:46    Cardiac Studies     Patient Profile     71 y.o. female Hx of HTN, DM, arthrits   and tob use   Admitted with CP    Assessment & Plan    1  CP Pt with risk factors for CAD  Symptoms typical and atypical  For nuclear stress test today    2  HTN  BP has been high since admit  No change in meds from home meds  I would increase amlodipine to 5 bid  Follow    3  Tob  Counselled on cessation    4  HL  LDL 70  HDL 35  Trig 180  COntinue statin    Signed, Dietrich Pates, MD  05/17/2017, 8:01 AM

## 2017-05-17 NOTE — Progress Notes (Signed)
Nuc result: IMPRESSION: 1. No reversible ischemia or infarction. 2. Lateral wall hypokinesis. Otherwise normal left ventricular wall motion. 3. Left ventricular ejection fraction 57% 4. Non invasive risk stratification*: Low *2012 Appropriate Use Criteria for Coronary Revascularization Focused Update: J Am Coll Cardiol. 2012;59(9):857-881. http://content.dementiazones.comonlinejacc.org/article.aspx?articleid=1201161  2D echo Study Conclusions  - Left ventricle: The cavity size was normal. Wall thickness was   increased in a pattern of mild LVH. There was focal basal   hypertrophy. Systolic function was normal. The estimated ejection   fraction was in the range of 55% to 60%. Wall motion was normal;   there were no regional wall motion abnormalities. Features are   consistent with a pseudonormal left ventricular filling pattern,   with concomitant abnormal relaxation and increased filling   pressure (grade 2 diastolic dysfunction). - Pulmonary arteries: Systolic pressure was mildly increased. PA   peak pressure: 38 mm Hg (S).  Reviewed result with Dr. Elease HashimotoNahser given ? of lateral wall hypokinesis - he states that nuc is not the best way to quantify wall motion, and echo is reassuring with normal wall motion. This did show diastolic dysfunction but per notes patient is said to be euvolemic. Further eval of probable noncardiac pain per primary team. Reviewed results with patient, emphasized importance of BP, weight, and sodium control. Updated IM of study results - per our discussion it sounds like they are awaiting further f/u of her potassium level.  Tabius Rood PA-C

## 2017-05-17 NOTE — Progress Notes (Signed)
Family Medicine Teaching Service Daily Progress Note Intern Pager: [phone]  Patient name: Joy Ryan Medical record number: [id number] Date of birth: [birth year] Age: 71 y.o. Gender: female  Primary Care Provider: Patient, No Pcp Per Consultants: Cardiology  Code Status: full  Pt Overview and Major Events to Date:  5/27: admitted for chest pain  5/28:   Assessment and Plan: Joy Ryan is a 71 y.o. female presenting with chest pain . PMH is significant for hypertension, diabetes, tobacco use disorder, hypothyroidism/thyroidectomy, mood issues, chronic back pain.   Typical and atypical chest pain, resolved: On admission was substernal, exertional, relieved by NTG. HEART score-7 (highly suspicious history, age, > 3 risk factors, EKG changes). Wells score for PE 0 indicating low probability of PE. Euvolemic on exam and BNP 9.1. CXR without infiltration or pleural edema. No fever or leukocytosis to suggest infectious etiology. Troponins negative over night. HDL low at 35, LDL 70, TG 147 - continue to monitor on telemetry - Cardiology consulted, f/u Lexiscan myoview - A1c still pending - Nitroglycerin 0.4mg  SL q10min prn chest pain - O2 by [place] prn hypoxia - Echocardiogram pending - Aspirin and Lipitor  Hypokalemia: K2.9 after repletion in the ED. Magnesium 1.9 Could be secondary to HCTZ, may need to consider discontinuation of HCTZ  - f/u BMP at 5pm   Hypertension: Blood pressure fairly well controlled this hospitalization. On losartan/HCTZ 100/25 mg daily, amlodipine 5 mg daily at home -Continue home meds -Will add BB as needed, consider trading HCTZ for a BB   Diabetes: Last A1c in our system was 9.6 in 2015. She is on Janumet at home. CBGs controlled currently -Hold metformin -SSI-moderate for obesity -BG  ACHS -f/u A1c  History of hypothyroidism/thyroidectomy. On Synthroid 250 mcg at home. TSH 1.388 -Continue home Synthroid  Hyperlipidemia: takes pravastatin 20  mg at home -We'll start atorvastatin  Chronic back pain: Patient with multiple back surgery and resultant mild left hemiparesis and hemiplegia. Percocet, Flexeril and diazepam on her medication list. Hasn't taken any of these in over 2 weeks.  -Percocet as needed -Flexeril as needed; consider transitioning to baclofen given her age -Hold diazepam   History of anxiety: on prozac as outpatient -continue home prozac.   Tobacco use disorder: Reports smoking about a pack a day. She is interested in quitting.  -Nicotine patch daily - nurse to provide tobacco cessation counseling  FEN/GI: heart healthy/carb modified  PPx: heparin [place]  Disposition: pending chest pain work up  Subjective:  Patient without chest pain or SOB. She's visiting from [place], planning to go back there in 1-2 weeks. Has a PCP there.   Objective: Temp:  [97.8 F (36.6 C)-98.3 F (36.8 C)] 98.2 F (36.8 C) (05/28 0449) Pulse Rate:  [63-78] 68 (05/28 0449) Resp:  [12-16] 16 (05/28 0449) BP: (114-153)/(79-100) 126/97 (05/28 0449) SpO2:  [95 %-100 %] 98 % (05/28 0449) Weight:  [199 lb (90.3 kg)-210 lb 3.2 oz (95.3 kg)] 206 lb 9.6 oz (93.7 kg) (05/28 0449) Physical Exam: General: alert, well-nourished, in NAD Cardiovascular: RRR, no m/r/g noted. PMI not displaced Respiratory: no increased WOB, CTAB Abdomen: Soft, non-distended, nontender Extremities: Trace pitting edema, no pain. Negative Homan's  Laboratory:  Recent Labs Lab 05/16/17 1308 05/17/17 0509  WBC 7.3 5.6  HGB 12.3 11.5*  HCT 38.6 36.9  PLT 222 198    Recent Labs Lab 05/16/17 1308 05/16/17 1754 05/17/17 0509  NA 140  --  140  K 2.9*  --  2.9*  CL  105  --  105  CO2 28  --  28  BUN 7  --  8  CREATININE 0.68  --  0.65  CALCIUM 8.8*  --  8.7*  PROT  --  6.3*  --   BILITOT  --  0.3  --   ALKPHOS  --  76  --   ALT  --  22  --   AST  --  28  --   GLUCOSE 107*  --  167*   Troponin negative x 3  Imaging/Diagnostic Tests: Dg Chest 2  View  Result Date: 05/16/2017 CLINICAL DATA:  Chest pain and dry cough. EXAM: CHEST  2 VIEW COMPARISON:  None. FINDINGS: The heart size and mediastinal contours are within normal limits. Both lungs are clear. The visualized skeletal structures are unremarkable. IMPRESSION: No active cardiopulmonary disease. Electronically Signed   By: Gerome Samavid  Williams III M.D   On: 05/16/2017 13:46    Joanna Pufforsey, Crystal S, MD 05/17/2017, 7:35 AM PGY-3, [facility] Family Medicine FPTS Intern pager: [phone], text pages welcome

## 2017-05-17 NOTE — Progress Notes (Signed)
  Echocardiogram 2D Echocardiogram has been performed.  Joy Ryan 05/17/2017, 12:40 PM

## 2017-05-18 DIAGNOSIS — E876 Hypokalemia: Secondary | ICD-10-CM

## 2017-05-18 LAB — HEMOGLOBIN A1C
Hgb A1c MFr Bld: 7 % — ABNORMAL HIGH (ref 4.8–5.6)
Mean Plasma Glucose: 154 mg/dL

## 2017-06-13 ENCOUNTER — Other Ambulatory Visit: Payer: Self-pay | Admitting: Family Medicine

## 2018-09-17 ENCOUNTER — Other Ambulatory Visit: Payer: Self-pay

## 2018-09-17 ENCOUNTER — Emergency Department (HOSPITAL_COMMUNITY): Payer: Medicare Other

## 2018-09-17 ENCOUNTER — Encounter (HOSPITAL_COMMUNITY): Payer: Self-pay | Admitting: Emergency Medicine

## 2018-09-17 ENCOUNTER — Emergency Department (HOSPITAL_COMMUNITY)
Admission: EM | Admit: 2018-09-17 | Discharge: 2018-09-17 | Disposition: A | Discharge status: Home / Self Care | Payer: Medicare Other | Attending: Emergency Medicine | Admitting: Emergency Medicine

## 2018-09-17 DIAGNOSIS — Z7984 Long term (current) use of oral hypoglycemic drugs: Secondary | ICD-10-CM | POA: Insufficient documentation

## 2018-09-17 DIAGNOSIS — Z79899 Other long term (current) drug therapy: Secondary | ICD-10-CM | POA: Diagnosis not present

## 2018-09-17 DIAGNOSIS — R42 Dizziness and giddiness: Secondary | ICD-10-CM | POA: Insufficient documentation

## 2018-09-17 DIAGNOSIS — I10 Essential (primary) hypertension: Secondary | ICD-10-CM | POA: Diagnosis not present

## 2018-09-17 DIAGNOSIS — F17210 Nicotine dependence, cigarettes, uncomplicated: Secondary | ICD-10-CM | POA: Diagnosis not present

## 2018-09-17 DIAGNOSIS — E119 Type 2 diabetes mellitus without complications: Secondary | ICD-10-CM | POA: Insufficient documentation

## 2018-09-17 LAB — COMPREHENSIVE METABOLIC PANEL
ALK PHOS: 103 U/L (ref 38–126)
ALT: 20 U/L (ref 0–44)
ANION GAP: 12 (ref 5–15)
AST: 30 U/L (ref 15–41)
Albumin: 3.6 g/dL (ref 3.5–5.0)
BUN: 18 mg/dL (ref 8–23)
CALCIUM: 9.5 mg/dL (ref 8.9–10.3)
CO2: 24 mmol/L (ref 22–32)
Chloride: 102 mmol/L (ref 98–111)
Creatinine, Ser: 1.24 mg/dL — ABNORMAL HIGH (ref 0.44–1.00)
GFR, EST AFRICAN AMERICAN: 50 mL/min — AB (ref >60)
GFR, EST NON AFRICAN AMERICAN: 43 mL/min — AB (ref >60)
Glucose, Bld: 159 mg/dL — ABNORMAL HIGH (ref 70–99)
Potassium: 3.5 mmol/L (ref 3.5–5.1)
Sodium: 138 mmol/L (ref 135–145)
TOTAL PROTEIN: 7.3 g/dL (ref 6.5–8.1)
Total Bilirubin: 0.5 mg/dL (ref 0.3–1.2)

## 2018-09-17 LAB — CBC
HEMATOCRIT: 44.9 % (ref 36.0–46.0)
Hemoglobin: 14.1 g/dL (ref 12.0–15.0)
MCH: 29.1 pg (ref 26.0–34.0)
MCHC: 31.4 g/dL (ref 30.0–36.0)
MCV: 92.8 fL (ref 78.0–100.0)
Platelets: 228 10*3/uL (ref 150–400)
RBC: 4.84 MIL/uL (ref 3.87–5.11)
RDW: 14.3 % (ref 11.5–15.5)
WBC: 8.3 10*3/uL (ref 4.0–10.5)

## 2018-09-17 LAB — DIFFERENTIAL
Abs Immature Granulocytes: 0 10*3/uL (ref 0.0–0.1)
Basophils Absolute: 0 10*3/uL (ref 0.0–0.1)
Basophils Relative: 1 %
EOS PCT: 1 %
Eosinophils Absolute: 0.1 10*3/uL (ref 0.0–0.7)
Immature Granulocytes: 0 %
LYMPHS PCT: 35 %
Lymphs Abs: 2.9 10*3/uL (ref 0.7–4.0)
MONO ABS: 0.6 10*3/uL (ref 0.1–1.0)
Monocytes Relative: 8 %
Neutro Abs: 4.7 10*3/uL (ref 1.7–7.7)
Neutrophils Relative %: 55 %

## 2018-09-17 LAB — I-STAT CHEM 8, ED
BUN: 21 mg/dL (ref 8–23)
CHLORIDE: 104 mmol/L (ref 98–111)
CREATININE: 1.3 mg/dL — AB (ref 0.44–1.00)
Calcium, Ion: 1.16 mmol/L (ref 1.15–1.40)
GLUCOSE: 161 mg/dL — AB (ref 70–99)
HCT: 45 % (ref 36.0–46.0)
HEMOGLOBIN: 15.3 g/dL — AB (ref 12.0–15.0)
POTASSIUM: 3.4 mmol/L — AB (ref 3.5–5.1)
Sodium: 142 mmol/L (ref 135–145)
TCO2: 28 mmol/L (ref 22–32)

## 2018-09-17 LAB — I-STAT TROPONIN, ED: Troponin i, poc: 0 ng/mL (ref 0.00–0.08)

## 2018-09-17 LAB — PROTIME-INR
INR: 0.98
PROTHROMBIN TIME: 12.9 s (ref 11.4–15.2)

## 2018-09-17 LAB — CBG MONITORING, ED: Glucose-Capillary: 151 mg/dL — ABNORMAL HIGH (ref 70–99)

## 2018-09-17 LAB — APTT: APTT: 31 s (ref 24–36)

## 2018-09-17 MED ORDER — ACETAMINOPHEN 500 MG PO TABS
1000.0000 mg | ORAL_TABLET | Freq: Once | ORAL | Status: AC
  Administered 2018-09-17: 1000 mg via ORAL
  Filled 2018-09-17: qty 2

## 2018-09-17 MED ORDER — OXYCODONE-ACETAMINOPHEN 5-325 MG PO TABS
1.0000 | ORAL_TABLET | Freq: Once | ORAL | Status: AC
  Administered 2018-09-17: 1 via ORAL
  Filled 2018-09-17: qty 1

## 2018-09-17 NOTE — ED Notes (Signed)
Patient verbalizes understanding of discharge instructions. Opportunity for questioning and answers were provided. Pt discharged from ED. 

## 2018-09-17 NOTE — ED Notes (Signed)
Patient transported to CT 

## 2018-09-17 NOTE — ED Notes (Signed)
Pt returned from MRI °

## 2018-09-17 NOTE — ED Provider Notes (Addendum)
[facility] EMERGENCY DEPARTMENT Provider Note   CSN: [id number] Arrival date & time: 09/17/18  7829     History   Chief Complaint Chief Complaint  Patient presents with  . Dizziness    HPI Joy Ryan is a 72 y.o. female.  HPI Patient is a 72 year old female who was last seen well last night presents the emergency department feeling off balance and unsteady today.  She awoke this way.  Nursing staff is concerned about a possible slight left-sided facial droop.  This is minimally appreciable at the bedside.  Patient denies new weakness of her arms or legs.  She denies significant headache.  Speech is normal.  No significant headache.  No fevers or chills.  No chest pain or shortness of breath.  Denies back pain.  No history of vertigo.   Past Medical History:  Diagnosis Date  . Arthritis   . Diabetes mellitus without complication (HCC)   . Graves disease   . Headache   . Hypertension   . Obesity     Patient Active Problem List   Diagnosis Date Noted  . Hypokalemia   . Chest pain 10/17/2014  . Headache 10/17/2014  . Type 2 diabetes mellitus (HCC) 10/17/2014  . HTN (hypertension) 10/17/2014  . Tobacco abuse 10/17/2014    Past Surgical History:  Procedure Laterality Date  . ABDOMINAL HYSTERECTOMY    . APPENDECTOMY    . CHOLECYSTECTOMY    . HERNIA REPAIR    . LUMBAR DISC SURGERY    . polyps colon    . THYROIDECTOMY    . TONSILLECTOMY       OB History   None      Home Medications    Prior to Admission medications   Medication Sig Start Date End Date Taking? Authorizing Provider  amLODipine (NORVASC) 5 MG tablet Take 1 tablet (5 mg total) by mouth 2 (two) times daily. Patient taking differently: Take 5 mg by mouth daily.  05/17/17  Yes Joanna Puff, MD  Artificial Tear Ointment (DRY EYES OP) Place 2 drops into both eyes daily.   Yes [provider]  cyclobenzaprine (FLEXERIL) 5 MG tablet Take 5 mg by mouth 3 (three)  times daily as needed for muscle spasms.   Yes [provider]  diazepam (VALIUM) 10 MG tablet Take 0.5 tablets (5 mg total) by mouth every 12 (twelve) hours as needed for anxiety. Patient taking differently: Take 10 mg by mouth at bedtime.  10/17/14  Yes Christiane Ha, MD  FLUoxetine (PROZAC) 40 MG capsule Take 40 mg by mouth daily.   Yes [provider]  gabapentin (NEURONTIN) 300 MG capsule Take 300 mg by mouth See admin instructions. Take 300 mg by mouth two to three times a day   Yes [provider]  levothyroxine (SYNTHROID, LEVOTHROID) 200 MCG tablet Take 200 mcg by mouth daily before breakfast.   Yes [provider]  levothyroxine (SYNTHROID, LEVOTHROID) 25 MCG tablet Take 1 tablet (25 mcg total) by mouth daily before breakfast. Take with 200 mcg tablet Patient taking differently: Take 50 mcg by mouth daily before breakfast.  10/18/14  Yes Christiane Ha, MD  losartan-hydrochlorothiazide (HYZAAR) 100-25 MG per tablet Take 1 tablet by mouth daily.   Yes [provider]  omeprazole (PRILOSEC) 40 MG capsule Take 40 mg by mouth daily.   Yes [provider]  oxyCODONE-acetaminophen (PERCOCET) 7.5-325 MG tablet Take 1 tablet by mouth every 6 (six) hours.  Yes [provider]  SitaGLIPtin-MetFORMIN HCl (JANUMET XR) [id number] MG TB24 Take 1 tablet by mouth every evening.   Yes [provider]  atorvastatin (LIPITOR) 40 MG tablet Take 1 tablet (40 mg total) by mouth daily at 6 PM. Patient not taking: Reported on 09/17/2018 05/18/17   Joanna Puff, MD  potassium chloride 20 MEQ TBCR Take 10 mEq by mouth daily. Patient not taking: Reported on 09/17/2018 05/17/17   Joanna Puff, MD    Family History No family history on file.  Social History Social History   Tobacco Use  . Smoking status: Current Every Day Smoker    Packs/day: 0.25    Years: 20.00    Pack years: 5.00    Types: Cigarettes  . Smokeless  tobacco: Never Used  Substance Use Topics  . Alcohol use: No  . Drug use: No     Allergies   Aspirin   Review of Systems Review of Systems  All other systems reviewed and are negative.    Physical Exam Updated Vital Signs BP 109/64   Pulse 73   Temp 98 F (36.7 C)   Resp 15   SpO2 100%   Physical Exam  Constitutional: She is oriented to person, place, and time. She appears well-developed and well-nourished.  HENT:  Head: Normocephalic and atraumatic.  Eyes: Pupils are equal, round, and reactive to light. EOM are normal.  Neck: Normal range of motion.  Cardiovascular: Regular rhythm.  Pulmonary/Chest: Effort normal.  Abdominal: Soft. She exhibits no distension.  Musculoskeletal: Normal range of motion.  Neurological: She is alert and oriented to person, place, and time.  5/5 strength in major muscle groups of  bilateral upper and lower extremities. Speech normal. No facial asymetry.   Psychiatric: She has a normal mood and affect.  Nursing note and vitals reviewed.    ED Treatments / Results  Labs (all labs ordered are listed, but only abnormal results are displayed) Labs Reviewed  COMPREHENSIVE METABOLIC PANEL - Abnormal; Notable for the following components:      Result Value   Glucose, Bld 159 (*)    Creatinine, Ser 1.24 (*)    GFR calc non Af Amer 43 (*)    GFR calc Af Amer 50 (*)    All other components within normal limits  CBG MONITORING, ED - Abnormal; Notable for the following components:   Glucose-Capillary 151 (*)    All other components within normal limits  I-STAT CHEM 8, ED - Abnormal; Notable for the following components:   Potassium 3.4 (*)    Creatinine, Ser 1.30 (*)    Glucose, Bld 161 (*)    Hemoglobin 15.3 (*)    All other components within normal limits  CBC  DIFFERENTIAL  PROTIME-INR  APTT  ETHANOL  RAPID URINE DRUG SCREEN, HOSP PERFORMED  URINALYSIS, ROUTINE W REFLEX MICROSCOPIC  I-STAT TROPONIN, ED    EKG EKG  Interpretation  Date/Time:  Saturday September 17 2018 10:04:46 EDT Ventricular Rate:  84 PR Interval:    QRS Duration: 91 QT Interval:  392 QTC Calculation: 464 R Axis:   46 Text Interpretation:  Sinus rhythm No significant change was found Confirmed by Azalia Bilis ([id number]) on 09/17/2018 10:22:24 AM   Radiology Ct Head Wo Contrast  Result Date: 09/17/2018 CLINICAL DATA:  Headache.  Unsteady gait.  General weakness. EXAM: CT HEAD WITHOUT CONTRAST TECHNIQUE: Contiguous axial images were obtained from the base of the skull through the vertex without intravenous contrast. COMPARISON:  10/17/2014 FINDINGS: Brain: Mild atrophy with sulcal prominence most conspicuous about the vertex, similar to the 09/2014 examination. Gray-white differentiation appears maintained. No CT evidence of acute large territory infarct. No intraparenchymal or extra-axial mass or hemorrhage. Normal size and configuration of the ventricles and the basilar cisterns. No midline shift. Vascular: Minimal intracranial atherosclerosis. Skull: No displaced calvarial fracture. Sinuses/Orbits: Limited visualization of the paranasal sinuses and the mastoid air cells is normal. No air-fluid levels. A minimal amount of debris is seen within the bilateral external auditory canals. Post bilateral cataract surgery. Other: Regional soft tissues appear normal. IMPRESSION: Similar findings of atrophy without superimposed acute intracranial process. Electronically Signed   By: Simonne Come M.D.   On: 09/17/2018 11:35    Procedures Procedures (including critical care time)  Medications Ordered in ED Medications  acetaminophen (TYLENOL) tablet 1,000 mg (1,000 mg Oral Given 09/17/18 1231)     Initial Impression / Assessment and Plan / ED Course  I have reviewed the triage vital signs and the nursing notes.  Pertinent labs & imaging results that were available during my care of the patient were reviewed by me and considered in my medical  decision making (see chart for details).     Given ataxia and her advanced age she will undergo MRI to evaluate for central vertigo. MRI pending  4:49 PM Unable to complete MRI secondary to unknown device per MRI team.  Patient is overall well-appearing at this time.  She feels good.  She ambulates in the hall without difficulty.  She has no weakness.  She no longer is ataxic.  At this point I do not think she needs admission the hospital.  Outpatient neurology follow-up.  Primary care follow-up.  Understands return to the ER for new or worsening symptoms  Final Clinical Impressions(s) / ED Diagnoses   Final diagnoses:  None    ED Discharge Orders    None       Azalia Bilis, MD 09/17/18 1607    Azalia Bilis, MD 09/17/18 1650

## 2018-09-17 NOTE — ED Notes (Addendum)
Patient transported to MRI 

## 2018-09-17 NOTE — ED Notes (Signed)
Pt at MRI; unable to do 1600 neuro check; will complete neuro check upon pt's return

## 2018-09-17 NOTE — ED Notes (Signed)
Pt returned from MRI; Unable to complete due to spinal cord stimulator, no remote present to turn on/ off; MD aware

## 2018-09-17 NOTE — Progress Notes (Signed)
09/17/18 @ 1615 Pt states neurostimulator without proper safety info or programmable remote for MRI scanning. Need documentation on implant in order to review whether implant is safe for scanning. RN aware.

## 2018-09-17 NOTE — ED Notes (Signed)
Pt ambulatory w/ steady gait.

## 2018-09-17 NOTE — ED Triage Notes (Signed)
Pt reports sudden onset of "loss of balance" about an hour ago. 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 Speech clear, hand grips equal bilaterally. Pt a/ox4, resp e/u.

## 2018-09-25 ENCOUNTER — Emergency Department (HOSPITAL_COMMUNITY): Payer: Medicare (Managed Care)

## 2018-09-25 ENCOUNTER — Emergency Department (HOSPITAL_COMMUNITY)
Admission: EM | Admit: 2018-09-25 | Discharge: 2018-09-25 | Disposition: A | Discharge status: Home / Self Care | Payer: Medicare (Managed Care) | Attending: Emergency Medicine | Admitting: Emergency Medicine

## 2018-09-25 ENCOUNTER — Other Ambulatory Visit: Payer: Self-pay

## 2018-09-25 ENCOUNTER — Encounter (HOSPITAL_COMMUNITY): Payer: Self-pay | Admitting: Emergency Medicine

## 2018-09-25 DIAGNOSIS — R079 Chest pain, unspecified: Secondary | ICD-10-CM | POA: Diagnosis not present

## 2018-09-25 DIAGNOSIS — E079 Disorder of thyroid, unspecified: Secondary | ICD-10-CM | POA: Insufficient documentation

## 2018-09-25 DIAGNOSIS — I10 Essential (primary) hypertension: Secondary | ICD-10-CM | POA: Insufficient documentation

## 2018-09-25 DIAGNOSIS — F17210 Nicotine dependence, cigarettes, uncomplicated: Secondary | ICD-10-CM | POA: Diagnosis not present

## 2018-09-25 DIAGNOSIS — E119 Type 2 diabetes mellitus without complications: Secondary | ICD-10-CM | POA: Insufficient documentation

## 2018-09-25 DIAGNOSIS — R42 Dizziness and giddiness: Secondary | ICD-10-CM | POA: Insufficient documentation

## 2018-09-25 LAB — URINALYSIS, ROUTINE W REFLEX MICROSCOPIC
Bilirubin Urine: NEGATIVE
Glucose, UA: NEGATIVE mg/dL
Hgb urine dipstick: NEGATIVE
Ketones, ur: NEGATIVE mg/dL
Nitrite: NEGATIVE
PH: 6 (ref 5.0–8.0)
Protein, ur: NEGATIVE mg/dL
Specific Gravity, Urine: 1.014 (ref 1.005–1.030)

## 2018-09-25 LAB — CBC
HEMATOCRIT: 41.7 % (ref 36.0–46.0)
Hemoglobin: 13.6 g/dL (ref 12.0–15.0)
MCH: 30 pg (ref 26.0–34.0)
MCHC: 32.6 g/dL (ref 30.0–36.0)
MCV: 91.9 fL (ref 78.0–100.0)
Platelets: 261 10*3/uL (ref 150–400)
RBC: 4.54 MIL/uL (ref 3.87–5.11)
RDW: 14.4 % (ref 11.5–15.5)
WBC: 8.5 10*3/uL (ref 4.0–10.5)

## 2018-09-25 LAB — BASIC METABOLIC PANEL
Anion gap: 10 (ref 5–15)
BUN: 15 mg/dL (ref 8–23)
CALCIUM: 9.6 mg/dL (ref 8.9–10.3)
CO2: 30 mmol/L (ref 22–32)
Chloride: 104 mmol/L (ref 98–111)
Creatinine, Ser: 1.03 mg/dL — ABNORMAL HIGH (ref 0.44–1.00)
GFR calc Af Amer: >60 mL/min (ref >60)
GFR calc non Af Amer: 54 mL/min — ABNORMAL LOW (ref >60)
Glucose, Bld: 121 mg/dL — ABNORMAL HIGH (ref 70–99)
Potassium: 3.1 mmol/L — ABNORMAL LOW (ref 3.5–5.1)
Sodium: 144 mmol/L (ref 135–145)

## 2018-09-25 LAB — CBG MONITORING, ED: Glucose-Capillary: 155 mg/dL — ABNORMAL HIGH (ref 70–99)

## 2018-09-25 LAB — I-STAT TROPONIN, ED: Troponin i, poc: 0 ng/mL (ref 0.00–0.08)

## 2018-09-25 MED ORDER — OXYCODONE-ACETAMINOPHEN 5-325 MG PO TABS
1.0000 | ORAL_TABLET | Freq: Once | ORAL | Status: AC
  Administered 2018-09-25: 1 via ORAL
  Filled 2018-09-25: qty 1

## 2018-09-25 NOTE — Discharge Instructions (Addendum)
You were evaluated in the Emergency Department and after careful evaluation, we did not find any emergent condition requiring admission or further testing in the hospital. ° °Please return to the Emergency Department if you experience any worsening of your condition.  We encourage you to follow up with a primary care provider.  Thank you for allowing us to be a part of your care. °
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
## 2018-09-25 NOTE — ED Provider Notes (Signed)
[facility] Emergency Department Provider Note MRN:  [id number]  Arrival date & time: 09/25/18     Chief Complaint   Dizziness and Chest Pain   History of Present Illness   Joy Ryan is a 72 y.o. year-old female with a history of diabetes, hypertension presenting to the ED with chief complaint of chest pain and dizziness.  Patient explains that she was recently evaluated at [facility] for this similar episode of dizziness, had a CT that was normal but they were unable to MRI her brain due to her spinal stimulator.  Early this morning, patient had return of her dizziness, described as the room spinning, persistent throughout the day causing her to have balance issues.  Also endorsing central chest pressure that began this morning.  Pain is mild, constant, nonradiating.  No recent fever cough, no shortness of breath, no dizziness or diaphoresis.  No numbness or weakness of the arms or legs.  Review of Systems  A complete 10 system review of systems was obtained and all systems are negative except as noted in the HPI and PMH.   Patient's Health History    Past Medical History:  Diagnosis Date  . Arthritis   . Diabetes mellitus without complication (HCC)   . Graves disease   . Headache   . Hypertension   . Obesity     Past Surgical History:  Procedure Laterality Date  . ABDOMINAL HYSTERECTOMY    . APPENDECTOMY    . CHOLECYSTECTOMY    . HERNIA REPAIR    . LUMBAR DISC SURGERY    . polyps colon    . THYROIDECTOMY    . TONSILLECTOMY      History reviewed. No pertinent family history.  Social History   Socioeconomic History  . Marital status: Single    Spouse name: Not on file  . Number of children: Not on file  . Years of education: Not on file  . Highest education level: Not on file  Occupational History  . Not on file  Social Needs  . Financial resource strain: Not on file  . Food insecurity:    Worry: Not on file    Inability: Not on file    . Transportation needs:    Medical: Not on file    Non-medical: Not on file  Tobacco Use  . Smoking status: Current Every Day Smoker    Packs/day: 0.25    Years: 20.00    Pack years: 5.00    Types: Cigarettes  . Smokeless tobacco: Never Used  Substance and Sexual Activity  . Alcohol use: No  . Drug use: No  . Sexual activity: Not on file  Lifestyle  . Physical activity:    Days per week: Not on file    Minutes per session: Not on file  . Stress: Not on file  Relationships  . Social connections:    Talks on phone: Not on file    Gets together: Not on file    Attends religious service: Not on file    Active member of club or organization: Not on file    Attends meetings of clubs or organizations: Not on file    Relationship status: Not on file  . Intimate partner violence:    Fear of current or ex partner: Not on file    Emotionally abused: Not on file    Physically abused: Not on file    Forced sexual activity: Not on file  Other Topics Concern  .  Not on file  Social History Narrative   disabled     Physical Exam  Vital Signs and Nursing Notes reviewed Vitals:   09/25/18 2000 09/25/18 2030  BP: 116/69 106/74  Pulse:    Resp: 15 16  Temp:    SpO2: 100% 98%    CONSTITUTIONAL: Well-appearing, NAD NEURO:  Alert and oriented x 3, normal strength and sensation, normal coordination, normal gait EYES:  eyes equal and reactive ENT/NECK:  no LAD, no JVD CARDIO: Regular rate, well-perfused, normal S1 and S2 PULM:  CTAB no wheezing or rhonchi GI/GU:  normal bowel sounds, non-distended, non-tender MSK/SPINE:  No gross deformities, no edema SKIN:  no rash, atraumatic PSYCH:  Appropriate speech and behavior  Diagnostic and Interventional Summary    EKG Interpretation  Date/Time:  Sunday September 25 2018 16:46:44 EDT Ventricular Rate:  91 PR Interval:    QRS Duration: 85 QT Interval:  388 QTC Calculation: 478 R Axis:   29 Text Interpretation:  Sinus rhythm  Confirmed by Kennis Carina [phone]) on 09/25/2018 5:18:12 PM      Labs Reviewed  BASIC METABOLIC PANEL - Abnormal; Notable for the following components:      Result Value   Potassium 3.1 (*)    Glucose, Bld 121 (*)    Creatinine, Ser 1.03 (*)    GFR calc non Af Amer 54 (*)    All other components within normal limits  URINALYSIS, ROUTINE W REFLEX MICROSCOPIC - Abnormal; Notable for the following components:   APPearance HAZY (*)    Leukocytes, UA TRACE (*)    Bacteria, UA RARE (*)    All other components within normal limits  CBG MONITORING, ED - Abnormal; Notable for the following components:   Glucose-Capillary 155 (*)    All other components within normal limits  CBC  I-STAT TROPONIN, ED  I-STAT TROPONIN, ED    DG Chest 2 View  Final Result      Medications  oxyCODONE-acetaminophen (PERCOCET/ROXICET) 5-325 MG per tablet 1 tablet (1 tablet Oral Given 09/25/18 1756)     Procedures Critical Care  ED Course and Medical Decision Making  I have reviewed the triage vital signs and the nursing notes.  Pertinent labs & imaging results that were available during my care of the patient were reviewed by me and considered in my medical decision making (see below for details).  Concern for possible central vertigo in this 72 year old female.  Patient explains that she brought her spinal stimulator information and should be able to get an MRI today.  Chest pain possibly cardiac in etiology, troponin pending.  EKG with no ischemic changes, troponin negative.  Unable to perform MRI once again today, due to our MRI machine here in the [facility] long emergency department and the lack of confidence in compatibility.  Patient is disappointed by this news but is otherwise feeling well, her dizziness is mostly related to position, worse when laying flat.  Still some concern for possible central vertigo given her age and risk factors, but cannot assess with MRI tonight.  Patient requesting discharge,  explains that she will call her spinal stimulator coordinator first thing in the morning and work toward getting this MRI done as an outpatient.  I personally walked the patient in the ED, she did very well and was walking at baseline per family.  After the discussed management above, the patient was determined to be safe for discharge.  The patient was in agreement with this plan and all questions regarding  their care were answered.  ED return precautions were discussed and the patient will return to the ED with any significant worsening of condition.  Elmer Sow. Pilar Plate, MD [facility] Emergency Medicine [facility] [email or web address]  Final Clinical Impressions(s) / ED Diagnoses     ICD-10-CM   1. Dizziness R42   2. Chest pain R07.9 DG Chest 2 View    DG Chest 2 View    ED Discharge Orders    None         Sabas Sous, MD 09/25/18 2147

## 2018-09-25 NOTE — ED Notes (Signed)
Patient transported to MRI 

## 2018-09-25 NOTE — ED Notes (Signed)
Patient transported to X-ray 

## 2020-03-07 IMAGING — CR DG CHEST 2V
2 series · 2 of 2 positions shown · non-contrast
Comparison: 05/16/2017

CLINICAL DATA: Dizziness, weakness for several days, chest
heaviness at mid chest, imbalance, hypertension, diabetes mellitus,
smoker

EXAM:
CHEST - 2 VIEW

[w chest pa]
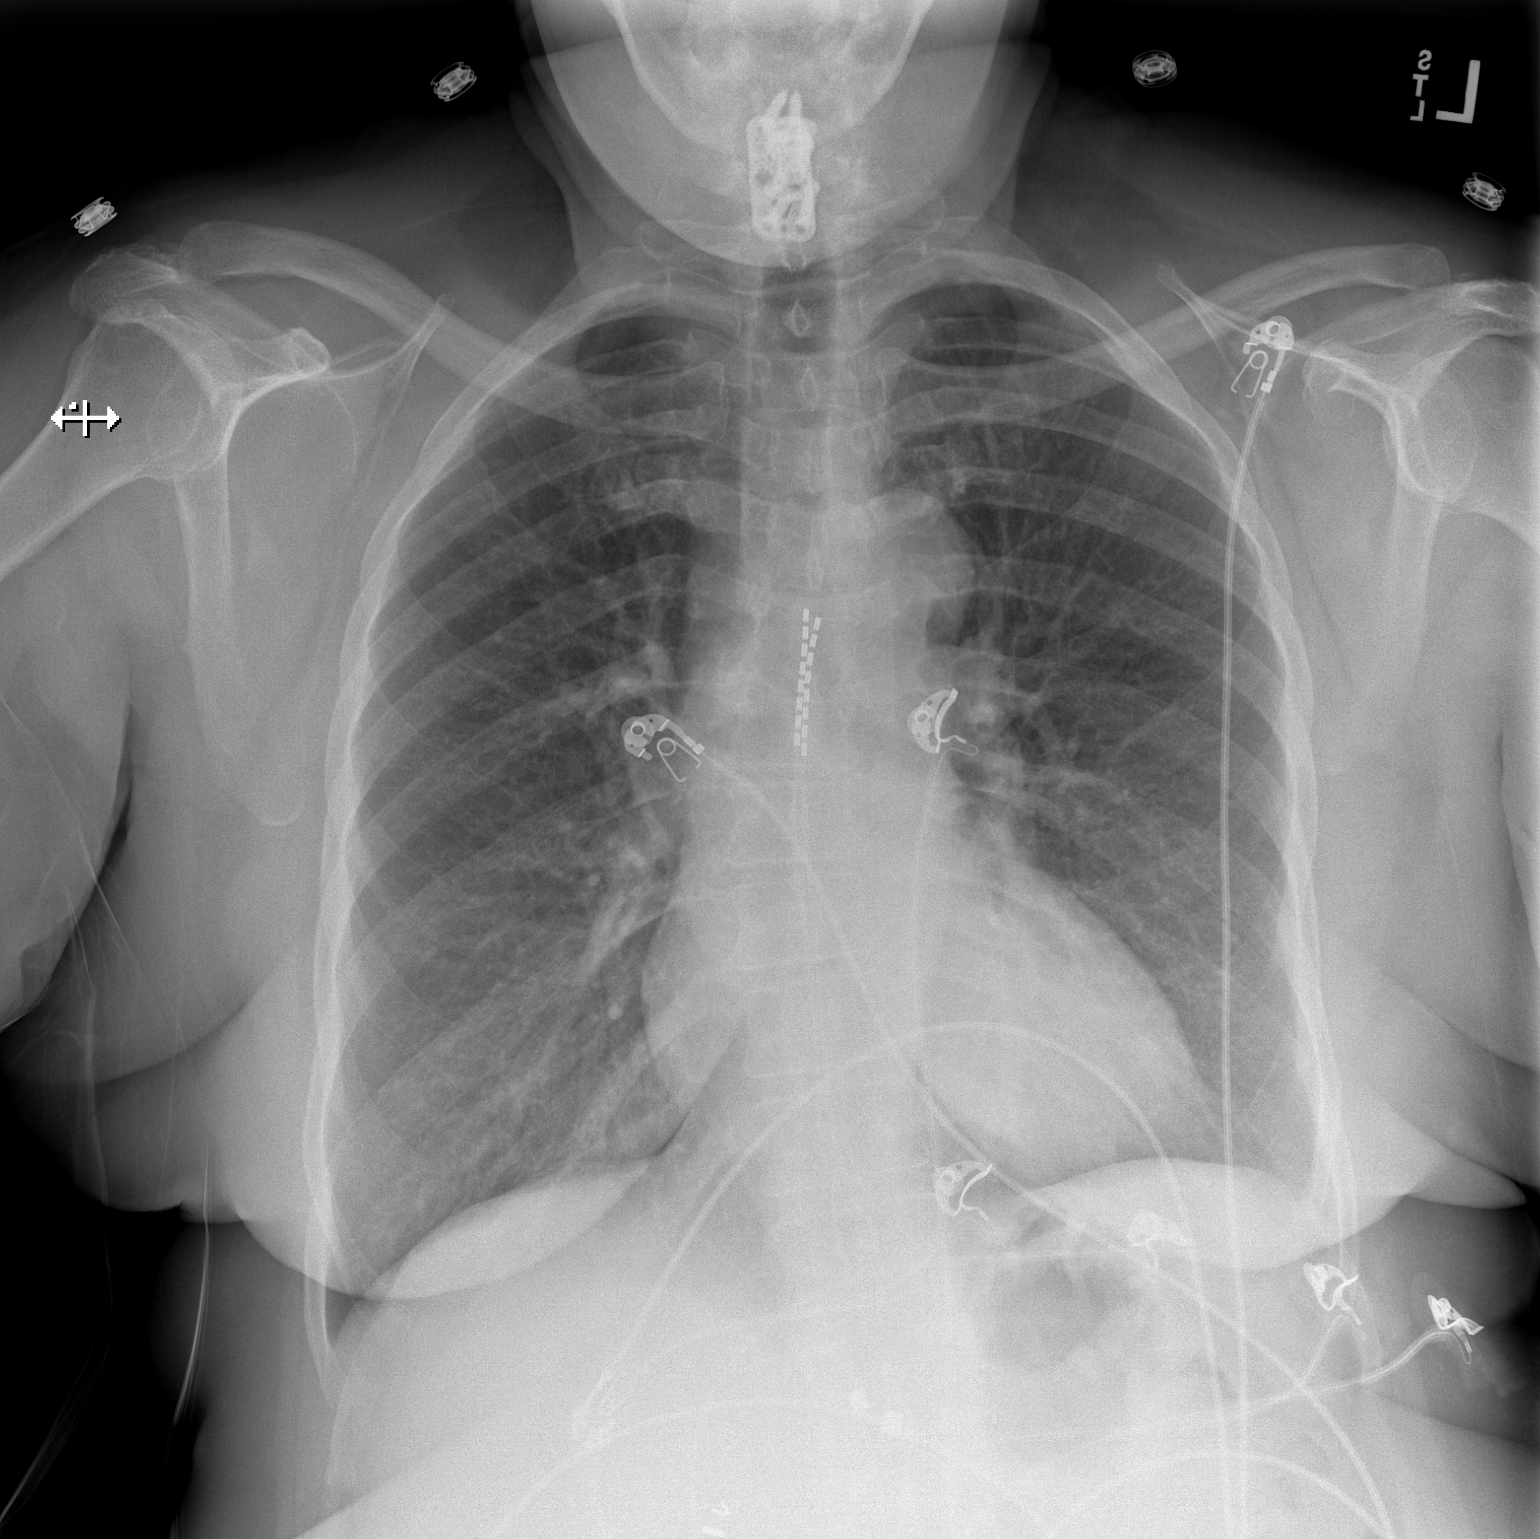

[w chest lat]
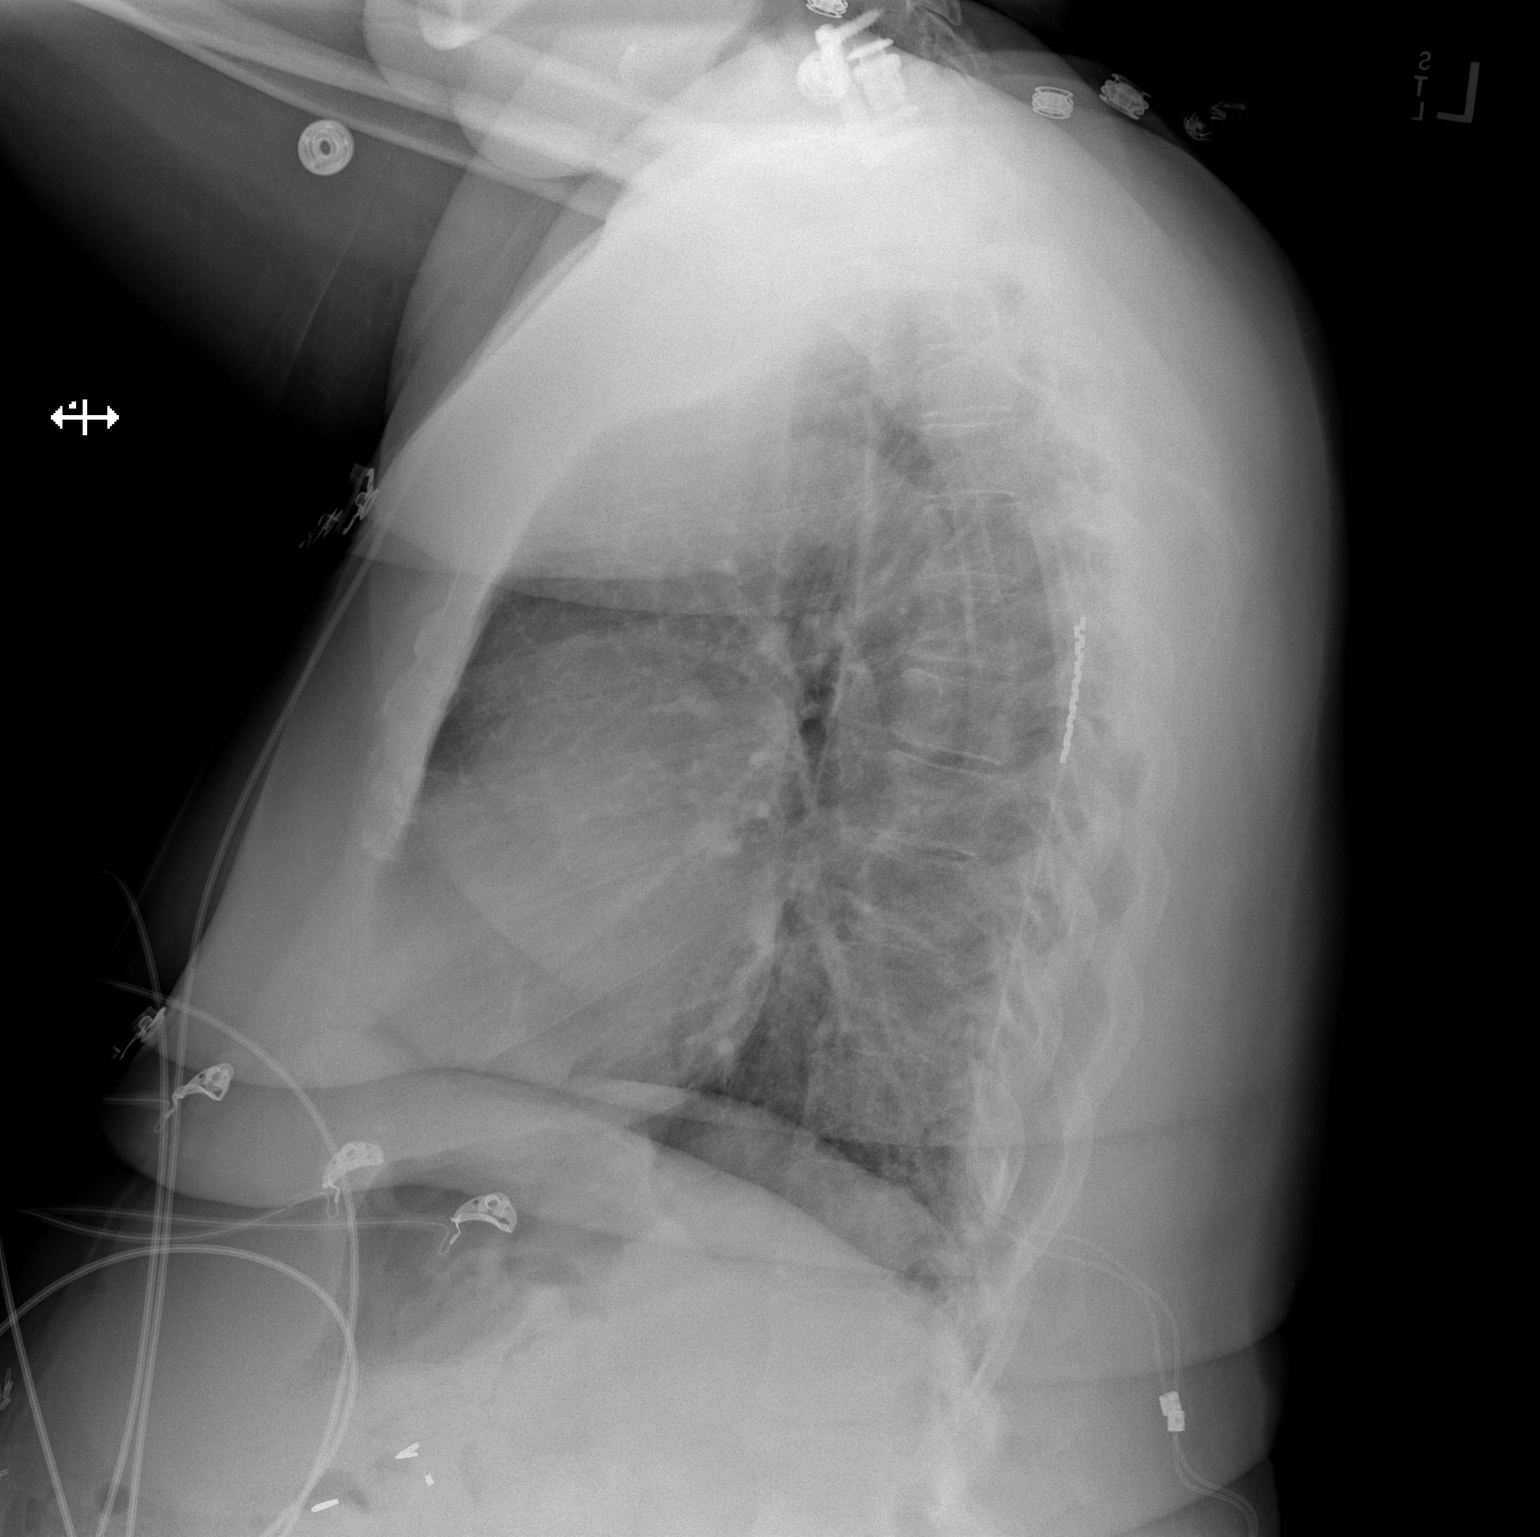

[2 of 2 positions shown; findings below may reference images not displayed]

FINDINGS: Upper normal heart size.

Mediastinal contours and pulmonary vascularity normal.

Lungs clear.

No pleural effusion or pneumothorax.

Prior cervical spine fusion and intraspinal stimulator placement.

No acute osseous findings.
IMPRESSION: No acute abnormalities.
